# Patient Record
Sex: Female | Born: 1950 | Race: White | Hispanic: No | Marital: Married | Smoking: Never smoker
Health system: Southern US, Community
[De-identification: ages and names within clinical notes are randomized; demographics above are authoritative.]

## PROBLEM LIST (undated history)

## (undated) DIAGNOSIS — J189 Pneumonia, unspecified organism: Secondary | ICD-10-CM

## (undated) DIAGNOSIS — R519 Headache, unspecified: Secondary | ICD-10-CM

## (undated) DIAGNOSIS — K219 Gastro-esophageal reflux disease without esophagitis: Secondary | ICD-10-CM

## (undated) HISTORY — PX: COLONOSCOPY: SHX174

---

## 2000-05-06 ENCOUNTER — Emergency Department (HOSPITAL_COMMUNITY): Admission: EM | Admit: 2000-05-06 | Discharge: 2000-05-06 | Payer: Self-pay | Admitting: Emergency Medicine

## 2003-06-18 ENCOUNTER — Emergency Department (HOSPITAL_COMMUNITY): Admission: EM | Admit: 2003-06-18 | Discharge: 2003-06-18 | Payer: Self-pay | Admitting: Emergency Medicine

## 2003-06-18 ENCOUNTER — Encounter: Payer: Self-pay | Admitting: Emergency Medicine

## 2004-02-20 ENCOUNTER — Other Ambulatory Visit: Admission: RE | Admit: 2004-02-20 | Discharge: 2004-02-20 | Payer: Self-pay | Admitting: Family Medicine

## 2004-10-16 ENCOUNTER — Ambulatory Visit (HOSPITAL_COMMUNITY): Admission: RE | Admit: 2004-10-16 | Discharge: 2004-10-16 | Payer: Self-pay | Admitting: Gastroenterology

## 2005-04-15 ENCOUNTER — Other Ambulatory Visit: Admission: RE | Admit: 2005-04-15 | Discharge: 2005-04-15 | Payer: Self-pay | Admitting: Family Medicine

## 2007-09-12 ENCOUNTER — Other Ambulatory Visit: Admission: RE | Admit: 2007-09-12 | Discharge: 2007-09-12 | Payer: Self-pay | Admitting: Family Medicine

## 2008-10-29 ENCOUNTER — Other Ambulatory Visit: Admission: RE | Admit: 2008-10-29 | Discharge: 2008-10-29 | Payer: Self-pay | Admitting: Family Medicine

## 2010-02-16 ENCOUNTER — Encounter: Admission: RE | Admit: 2010-02-16 | Discharge: 2010-02-16 | Payer: Self-pay | Admitting: Family Medicine

## 2011-02-12 NOTE — Op Note (Signed)
NAMEAZHARIA, SURRATT                 ACCOUNT NO.:  [id number]   MEDICAL RECORD NO.:  [id number]          PATIENT TYPE:  AMB   LOCATION:  ENDO                         FACILITY:  [facility]   PHYSICIAN:  Petra Kuba, M.D.    DATE OF BIRTH:  1951/05/23   DATE OF PROCEDURE:  10/16/2004  DATE OF DISCHARGE:                                 OPERATIVE REPORT   PROCEDURE:  Esophagogastroduodenoscopy.   INDICATIONS FOR PROCEDURE:  Upper tract symptoms, longstanding.  Want to  proceed with a one-time EGD to rule out Barrett's or other etiologies.   Consent was signed after risks, benefits, methods, options thoroughly  discussed in the office and prior to any premeds given.   MEDICATIONS:  Additional medicines for this procedure were Demerol 10,  Versed 1.   PROCEDURE:  The video endoscope was inserted by direct vision.  The  esophagus was normal.  She did have a tiny hiatal hernia.  The scope was  passed into the stomach, advanced through a normal antrum, normal pylorus,  into a normal duodenal bulb, and around the C loop to a normal second  portion of the duodenum.  The scope was withdrawn back to the bulb, and a  good look there ruled out ulcers in all locations.  The scope was withdrawn  back to the stomach and retroflexed.  The angularis, cardia, fundus, lesser  and greater curve were normal on retroflexion visualization.  Straight  visualization of the stomach was normal.  No additional findings were seen.  Air was suctioned, the scope was slowly withdrawn.  Again, a good look at  the esophagus was normal.  The scope was removed.  The patient tolerated the  procedure well.  There was no obvious immediate complication.   ENDOSCOPIC DIAGNOSES:  1.  Tiny hiatal hernia.  2.  Otherwise normal esophagogastroduodenoscopy.   PLAN:  Happy to see back p.r.n.  Continue Nexium.  Yearly rectals and  guaiacs per Dr. Cliffton Asters.      MEM/MEDQ  D:  10/16/2004  T:  10/16/2004  Job:  [id number]   cc:   Stacie Acres. White, M.D.  [address]  [place]  [place] [address]  Fax: [phone]

## 2011-02-12 NOTE — Op Note (Signed)
Mary Walsh, PERUSKI                 ACCOUNT NO.:  [id number]   MEDICAL RECORD NO.:  [id number]          PATIENT TYPE:  AMB   LOCATION:  ENDO                         FACILITY:  [facility]   PHYSICIAN:  Petra Kuba, M.D.    DATE OF BIRTH:  1951/02/06   DATE OF PROCEDURE:  10/16/2004  DATE OF DISCHARGE:                                 OPERATIVE REPORT   PROCEDURE:  Colonoscopy.   INDICATIONS FOR PROCEDURE:  Screening.   Consent was signed after risks, benefits, methods, and options were  thoroughly discussed in the office.   MEDICINES USED:  Demerol 70, Versed 7.   DESCRIPTION OF PROCEDURE:  Rectal inspection was pertinent for external  hemorrhoids, small. Digital exam was negative. The pediatric video  adjustable colonoscope was inserted, easily advanced around the colon to the  cecum. This did not require any abdominal pressure or any position changes.  No abnormalities were seen on insertion.  The cecum was identified by the  appendiceal orifice and the ileocecal valve. In fact, the scope was inserted  a short ways into the terminal ileum which was normal. Photo documentation  was obtained. The scope was slowly withdrawn. The prep was adequate. There  was some liquid stool that required washing and suctioning. On slow  withdrawal through the colon, no abnormalities were seen, specifically no  polyps, tumors, masses, diverticula, etc.  Once back in the rectum,  anorectal pullthrough and retroflexion  some small hemorrhoids.  The scope  was straightened and readvanced a short ways up the left side of the colon,  air was suctioned, scope removed. The patient tolerated the procedure well.  There was no obvious or immediate complications.   ENDOSCOPIC DIAGNOSIS:  1.  Internal and external hemorrhoids.  2.  Otherwise within normal limits to the terminal ileum.   PLAN:  Recheck colon screening in five years.  Continue workup with a one  time EGD.      MEM/MEDQ  D:  10/16/2004  T:   10/16/2004  Job:  [id number]   cc:   Stacie Acres. White, M.D.  [address]  [place]  [place] [address]  Fax: [phone]

## 2011-06-24 ENCOUNTER — Other Ambulatory Visit: Payer: Self-pay | Admitting: Family Medicine

## 2011-06-24 DIAGNOSIS — Z1231 Encounter for screening mammogram for malignant neoplasm of breast: Secondary | ICD-10-CM

## 2011-07-12 ENCOUNTER — Ambulatory Visit
Admission: RE | Admit: 2011-07-12 | Discharge: 2011-07-12 | Disposition: A | Discharge status: Home / Self Care | Payer: BC Managed Care – PPO | Source: Ambulatory Visit | Attending: Family Medicine | Admitting: Family Medicine

## 2011-07-12 DIAGNOSIS — Z1231 Encounter for screening mammogram for malignant neoplasm of breast: Secondary | ICD-10-CM

## 2011-07-14 ENCOUNTER — Other Ambulatory Visit: Payer: Self-pay | Admitting: Family Medicine

## 2011-07-14 DIAGNOSIS — R928 Other abnormal and inconclusive findings on diagnostic imaging of breast: Secondary | ICD-10-CM

## 2011-07-30 ENCOUNTER — Ambulatory Visit
Admission: RE | Admit: 2011-07-30 | Discharge: 2011-07-30 | Disposition: A | Discharge status: Home / Self Care | Payer: BC Managed Care – PPO | Source: Ambulatory Visit | Attending: Family Medicine | Admitting: Family Medicine

## 2011-07-30 DIAGNOSIS — R928 Other abnormal and inconclusive findings on diagnostic imaging of breast: Secondary | ICD-10-CM

## 2012-08-08 ENCOUNTER — Other Ambulatory Visit (HOSPITAL_COMMUNITY)
Admission: RE | Admit: 2012-08-08 | Discharge: 2012-08-08 | Disposition: A | Discharge status: Home / Self Care | Payer: BC Managed Care – PPO | Source: Ambulatory Visit | Attending: Family Medicine | Admitting: Family Medicine

## 2012-08-08 ENCOUNTER — Other Ambulatory Visit: Payer: Self-pay | Admitting: Family Medicine

## 2012-08-08 DIAGNOSIS — Z0000 Encounter for general adult medical examination without abnormal findings: Secondary | ICD-10-CM | POA: Insufficient documentation

## 2014-07-01 ENCOUNTER — Other Ambulatory Visit: Payer: Self-pay | Admitting: Family Medicine

## 2014-07-01 ENCOUNTER — Other Ambulatory Visit (HOSPITAL_COMMUNITY)
Admission: RE | Admit: 2014-07-01 | Discharge: 2014-07-01 | Disposition: A | Discharge status: Home / Self Care | Payer: BC Managed Care – PPO | Source: Ambulatory Visit | Attending: Family Medicine | Admitting: Family Medicine

## 2014-07-01 DIAGNOSIS — Z124 Encounter for screening for malignant neoplasm of cervix: Secondary | ICD-10-CM | POA: Insufficient documentation

## 2014-07-04 LAB — CYTOLOGY - PAP

## 2015-09-19 ENCOUNTER — Other Ambulatory Visit: Payer: Self-pay | Admitting: Family Medicine

## 2015-09-19 DIAGNOSIS — Z1231 Encounter for screening mammogram for malignant neoplasm of breast: Secondary | ICD-10-CM

## 2015-10-17 ENCOUNTER — Ambulatory Visit
Admission: RE | Admit: 2015-10-17 | Discharge: 2015-10-17 | Disposition: A | Discharge status: Home / Self Care | Payer: Managed Care, Other (non HMO) | Source: Ambulatory Visit | Attending: Family Medicine | Admitting: Family Medicine

## 2015-10-17 DIAGNOSIS — Z1231 Encounter for screening mammogram for malignant neoplasm of breast: Secondary | ICD-10-CM

## 2016-02-21 ENCOUNTER — Emergency Department (HOSPITAL_BASED_OUTPATIENT_CLINIC_OR_DEPARTMENT_OTHER)
Admission: EM | Admit: 2016-02-21 | Discharge: 2016-02-21 | Disposition: A | Discharge status: Home / Self Care | Payer: Managed Care, Other (non HMO) | Attending: Emergency Medicine | Admitting: Emergency Medicine

## 2016-02-21 ENCOUNTER — Emergency Department (HOSPITAL_BASED_OUTPATIENT_CLINIC_OR_DEPARTMENT_OTHER): Payer: Managed Care, Other (non HMO)

## 2016-02-21 ENCOUNTER — Encounter (HOSPITAL_BASED_OUTPATIENT_CLINIC_OR_DEPARTMENT_OTHER): Payer: Self-pay | Admitting: Emergency Medicine

## 2016-02-21 DIAGNOSIS — Y999 Unspecified external cause status: Secondary | ICD-10-CM | POA: Diagnosis not present

## 2016-02-21 DIAGNOSIS — S63501A Unspecified sprain of right wrist, initial encounter: Secondary | ICD-10-CM | POA: Insufficient documentation

## 2016-02-21 DIAGNOSIS — S6991XA Unspecified injury of right wrist, hand and finger(s), initial encounter: Secondary | ICD-10-CM | POA: Diagnosis present

## 2016-02-21 DIAGNOSIS — X501XXA Overexertion from prolonged static or awkward postures, initial encounter: Secondary | ICD-10-CM | POA: Diagnosis not present

## 2016-02-21 DIAGNOSIS — Y929 Unspecified place or not applicable: Secondary | ICD-10-CM | POA: Insufficient documentation

## 2016-02-21 DIAGNOSIS — Y9301 Activity, walking, marching and hiking: Secondary | ICD-10-CM | POA: Diagnosis not present

## 2016-02-21 NOTE — ED Notes (Signed)
Patient states that her right wrist is hurting after being pulled by a dog. Noted swelling and bruising to her right wrist

## 2016-02-21 NOTE — ED Provider Notes (Signed)
CSN: [id number]     Arrival date & time 02/21/16  1538 History  By signing my name below, I, Mary Walsh, attest that this documentation has been prepared under the direction and in the presence of Malvin Johns, MD. Electronically Signed: Georgette Walsh, ED Scribe. 02/21/2016. 4:09 PM.      Chief Complaint  Patient presents with  . Wrist Pain   The history is provided by the patient. No language interpreter was used.   HPI Comments: Mary Walsh is a 65 y.o. female who presents to the Emergency Department complaining of moderate right wrist pain and swelling s/p injury that occurred today. Patient states she was walking her dog when her right wrist was twisted outward as the dog suddenly ran forward. Per pt, she then fell forward onto her bilateral hands and knees. She denies LOC, head injury, additional injuries. Pt is ambulatory without difficulty. Pt states her pain is worsened with movement. Patient denies numbness or paresthesia. Tetanus shots are up to date.   History reviewed. No pertinent past medical history. History reviewed. No pertinent past surgical history. History reviewed. No pertinent family history. Social History  Substance Use Topics  . Smoking status: Never Smoker   . Smokeless tobacco: None  . Alcohol Use: No   OB History    No data available     Review of Systems  Constitutional: Negative for fever.  Gastrointestinal: Negative for nausea and vomiting.  Musculoskeletal: Positive for joint swelling (right wrist) and arthralgias (right wrist). Negative for back pain and neck pain.  Skin: Negative for wound.  Neurological: Negative for weakness, numbness and headaches.       - paresthesia     Allergies  Codeine  Home Medications   Prior to Admission medications   Not on File   BP 139/80 mmHg  Pulse 94  Temp(Src) 98.8 F (37.1 C) (Oral)  Resp 18  Ht 5\' 8"  (1.727 m)  Wt 183 lb (83.008 kg)  BMI 27.83 kg/m2  SpO2 100% Physical Exam  Constitutional: She  is oriented to person, place, and time. She appears well-developed and well-nourished.  HENT:  Head: Normocephalic and atraumatic.  Neck: Normal range of motion. Neck supple.  Cardiovascular: Normal rate.   Right radial pulses are intact  Pulmonary/Chest: Effort normal.  Musculoskeletal: She exhibits edema and tenderness.  Swelling and ecchymosis to the dorsal side of right wrist overlying the distal radius. TTP to this area. No pain to hand or elbow. Normal motor function and sensation. No wounds.   Neurological: She is alert and oriented to person, place, and time.  Skin: Skin is warm and dry.  Psychiatric: She has a normal mood and affect.  Nursing note and vitals reviewed.   ED Course  Procedures (including critical care time) DIAGNOSTIC STUDIES: Oxygen Saturation is 100% on RA, normal by my interpretation.    COORDINATION OF CARE: 3:59 PM Discussed treatment plan with pt at bedside which includes x-ray and pt agreed to plan.  Imaging Review Dg Wrist Complete Right  02/21/2016  CLINICAL DATA:  Wrist injury while walking dog, initial encounter EXAM: RIGHT WRIST - COMPLETE 3+ VIEW COMPARISON:  None. FINDINGS: There is no evidence of fracture or dislocation. There is no evidence of arthropathy or other focal bone abnormality. Soft tissues are unremarkable. IMPRESSION: No acute abnormality noted. Electronically Signed   By: Inez Catalina M.D.   On: 02/21/2016 16:16   I have personally reviewed and evaluated these images as part of my medical  decision-making.  MDM   Final diagnoses:  Wrist sprain, right, initial encounter   No fracture noted.  NVI.  Will place in velcro wrist splint.  Advised Ice and elevation, NSAIDS.  Will refer to Dr. Barbaraann Barthel for f/u.   I personally performed the services described in this documentation, which was scribed in my presence.  The recorded information has been reviewed and considered.      Malvin Johns, MD 02/21/16 1710

## 2016-11-25 ENCOUNTER — Other Ambulatory Visit: Payer: Self-pay | Admitting: Family Medicine

## 2016-11-25 ENCOUNTER — Ambulatory Visit
Admission: RE | Admit: 2016-11-25 | Discharge: 2016-11-25 | Disposition: A | Discharge status: Home / Self Care | Payer: BLUE CROSS/BLUE SHIELD | Source: Ambulatory Visit | Attending: Family Medicine | Admitting: Family Medicine

## 2016-11-25 DIAGNOSIS — R509 Fever, unspecified: Secondary | ICD-10-CM

## 2018-06-14 DIAGNOSIS — Z823 Family history of stroke: Secondary | ICD-10-CM | POA: Diagnosis not present

## 2018-06-14 DIAGNOSIS — R69 Illness, unspecified: Secondary | ICD-10-CM | POA: Diagnosis not present

## 2018-06-14 DIAGNOSIS — Z91040 Latex allergy status: Secondary | ICD-10-CM | POA: Diagnosis not present

## 2018-06-14 DIAGNOSIS — Z809 Family history of malignant neoplasm, unspecified: Secondary | ICD-10-CM | POA: Diagnosis not present

## 2018-06-14 DIAGNOSIS — Z8249 Family history of ischemic heart disease and other diseases of the circulatory system: Secondary | ICD-10-CM | POA: Diagnosis not present

## 2018-06-14 DIAGNOSIS — M8580 Other specified disorders of bone density and structure, unspecified site: Secondary | ICD-10-CM | POA: Diagnosis not present

## 2018-11-16 ENCOUNTER — Other Ambulatory Visit (HOSPITAL_COMMUNITY)
Admission: RE | Admit: 2018-11-16 | Discharge: 2018-11-16 | Disposition: A | Discharge status: Home / Self Care | Payer: Medicare HMO | Source: Ambulatory Visit | Attending: Family Medicine | Admitting: Family Medicine

## 2018-11-16 ENCOUNTER — Other Ambulatory Visit: Payer: Self-pay | Admitting: Family Medicine

## 2018-11-16 DIAGNOSIS — Z1211 Encounter for screening for malignant neoplasm of colon: Secondary | ICD-10-CM | POA: Diagnosis not present

## 2018-11-16 DIAGNOSIS — Z0000 Encounter for general adult medical examination without abnormal findings: Secondary | ICD-10-CM | POA: Diagnosis not present

## 2018-11-16 DIAGNOSIS — M8588 Other specified disorders of bone density and structure, other site: Secondary | ICD-10-CM | POA: Diagnosis not present

## 2018-11-16 DIAGNOSIS — R7303 Prediabetes: Secondary | ICD-10-CM | POA: Diagnosis not present

## 2018-11-16 DIAGNOSIS — I4510 Unspecified right bundle-branch block: Secondary | ICD-10-CM | POA: Diagnosis not present

## 2018-11-16 DIAGNOSIS — Z124 Encounter for screening for malignant neoplasm of cervix: Secondary | ICD-10-CM | POA: Diagnosis not present

## 2018-11-16 DIAGNOSIS — E559 Vitamin D deficiency, unspecified: Secondary | ICD-10-CM | POA: Diagnosis not present

## 2018-11-16 DIAGNOSIS — E785 Hyperlipidemia, unspecified: Secondary | ICD-10-CM | POA: Diagnosis not present

## 2018-11-16 DIAGNOSIS — Z23 Encounter for immunization: Secondary | ICD-10-CM | POA: Diagnosis not present

## 2018-11-20 LAB — CYTOLOGY - PAP: DIAGNOSIS: NEGATIVE

## 2018-11-22 ENCOUNTER — Other Ambulatory Visit: Payer: Self-pay | Admitting: Family Medicine

## 2018-11-22 DIAGNOSIS — M8580 Other specified disorders of bone density and structure, unspecified site: Secondary | ICD-10-CM

## 2018-12-13 ENCOUNTER — Other Ambulatory Visit: Payer: Self-pay | Admitting: Family Medicine

## 2018-12-13 DIAGNOSIS — Z1231 Encounter for screening mammogram for malignant neoplasm of breast: Secondary | ICD-10-CM

## 2019-02-13 ENCOUNTER — Ambulatory Visit: Payer: Medicare HMO

## 2019-02-13 ENCOUNTER — Other Ambulatory Visit: Payer: Medicare HMO

## 2019-03-05 DIAGNOSIS — M2550 Pain in unspecified joint: Secondary | ICD-10-CM | POA: Diagnosis not present

## 2019-03-05 DIAGNOSIS — M25551 Pain in right hip: Secondary | ICD-10-CM | POA: Diagnosis not present

## 2019-03-05 DIAGNOSIS — R69 Illness, unspecified: Secondary | ICD-10-CM | POA: Diagnosis not present

## 2019-03-05 DIAGNOSIS — M25552 Pain in left hip: Secondary | ICD-10-CM | POA: Diagnosis not present

## 2019-04-03 DIAGNOSIS — M25551 Pain in right hip: Secondary | ICD-10-CM | POA: Diagnosis not present

## 2019-04-04 DIAGNOSIS — M25551 Pain in right hip: Secondary | ICD-10-CM | POA: Diagnosis not present

## 2019-04-09 ENCOUNTER — Ambulatory Visit
Admission: RE | Admit: 2019-04-09 | Discharge: 2019-04-09 | Disposition: A | Discharge status: Home / Self Care | Payer: Medicare HMO | Source: Ambulatory Visit | Attending: Family Medicine | Admitting: Family Medicine

## 2019-04-09 ENCOUNTER — Other Ambulatory Visit: Payer: Self-pay

## 2019-04-09 DIAGNOSIS — M8580 Other specified disorders of bone density and structure, unspecified site: Secondary | ICD-10-CM

## 2019-04-09 DIAGNOSIS — Z1231 Encounter for screening mammogram for malignant neoplasm of breast: Secondary | ICD-10-CM | POA: Diagnosis not present

## 2019-04-09 DIAGNOSIS — M8589 Other specified disorders of bone density and structure, multiple sites: Secondary | ICD-10-CM | POA: Diagnosis not present

## 2019-04-09 DIAGNOSIS — Z780 Asymptomatic menopausal state: Secondary | ICD-10-CM | POA: Diagnosis not present

## 2019-04-13 DIAGNOSIS — M25551 Pain in right hip: Secondary | ICD-10-CM | POA: Diagnosis not present

## 2019-04-18 DIAGNOSIS — Z1211 Encounter for screening for malignant neoplasm of colon: Secondary | ICD-10-CM | POA: Diagnosis not present

## 2019-04-23 DIAGNOSIS — R002 Palpitations: Secondary | ICD-10-CM | POA: Diagnosis not present

## 2019-04-23 DIAGNOSIS — R5382 Chronic fatigue, unspecified: Secondary | ICD-10-CM | POA: Diagnosis not present

## 2019-04-23 DIAGNOSIS — M8588 Other specified disorders of bone density and structure, other site: Secondary | ICD-10-CM | POA: Diagnosis not present

## 2019-04-27 DIAGNOSIS — M84351A Stress fracture, right femur, initial encounter for fracture: Secondary | ICD-10-CM | POA: Diagnosis not present

## 2019-04-27 DIAGNOSIS — M25551 Pain in right hip: Secondary | ICD-10-CM | POA: Diagnosis not present

## 2019-05-02 DIAGNOSIS — M8588 Other specified disorders of bone density and structure, other site: Secondary | ICD-10-CM | POA: Diagnosis not present

## 2019-05-02 DIAGNOSIS — M84351A Stress fracture, right femur, initial encounter for fracture: Secondary | ICD-10-CM | POA: Diagnosis not present

## 2019-05-09 DIAGNOSIS — M84351D Stress fracture, right femur, subsequent encounter for fracture with routine healing: Secondary | ICD-10-CM | POA: Diagnosis not present

## 2019-05-09 DIAGNOSIS — M84351A Stress fracture, right femur, initial encounter for fracture: Secondary | ICD-10-CM | POA: Diagnosis not present

## 2019-05-16 DIAGNOSIS — M84351D Stress fracture, right femur, subsequent encounter for fracture with routine healing: Secondary | ICD-10-CM | POA: Diagnosis not present

## 2019-05-16 DIAGNOSIS — M84351A Stress fracture, right femur, initial encounter for fracture: Secondary | ICD-10-CM | POA: Diagnosis not present

## 2019-05-22 ENCOUNTER — Other Ambulatory Visit: Payer: Self-pay

## 2019-05-22 ENCOUNTER — Encounter: Payer: Self-pay | Admitting: Physical Therapy

## 2019-05-22 ENCOUNTER — Ambulatory Visit: Payer: Medicare HMO | Attending: Orthopedic Surgery | Admitting: Physical Therapy

## 2019-05-22 DIAGNOSIS — M25551 Pain in right hip: Secondary | ICD-10-CM | POA: Diagnosis not present

## 2019-05-22 DIAGNOSIS — M25651 Stiffness of right hip, not elsewhere classified: Secondary | ICD-10-CM | POA: Diagnosis not present

## 2019-05-22 DIAGNOSIS — R262 Difficulty in walking, not elsewhere classified: Secondary | ICD-10-CM | POA: Insufficient documentation

## 2019-05-22 NOTE — Therapy (Signed)
[address] [place], [place], [address] Phone: [phone]   Fax:  [phone]  Physical Therapy Evaluation  Patient Details  Name: Mary Walsh MRN: [id number] Date of Birth: [birth year] Referring Provider (PT): Ainsley Spinner   Encounter Date: 05/22/2019  PT End of Session - 05/22/19 1134    Visit Number  1    Date for PT Re-Evaluation  07/22/19    PT Start Time  1057    PT Stop Time  1136    PT Time Calculation (min)  39 min    Activity Tolerance  Patient tolerated treatment well    Behavior During Therapy  [facility] for tasks assessed/performed       History reviewed. No pertinent past medical history.  History reviewed. No pertinent surgical history.  There were no vitals filed for this visit.   Subjective Assessment - 05/22/19 1100    Subjective  Patietn reports that at the end of March she swung a pick axe and hit a tree root and feels like she caused the fracture due to the hip pain stareting at that time.  She reports that she continued to walk and really started haivng pain, MRI revealed the fracture, she reports that over the past month the pain has really gotten a lot better.    Limitations  Walking;Lifting;House hold activities    Patient Stated Goals  have no pain, be stronger, be normal    Currently in Pain?  Yes    Pain Score  1     Pain Location  Hip    Pain Orientation  Right;Anterior    Pain Descriptors / Indicators  Aching;Sore;Tightness    Pain Type  Acute pain    Pain Radiating Towards  some pain into the right anterior thigh    Pain Onset  More than a month ago    Pain Frequency  Constant    Aggravating Factors   trying to jog, walking, sitting, hard seating surface, squatting at worst pain up to 8/10    Pain Relieving Factors  rest, pain meds pain can be 1/10    Effect of Pain on Daily Activities  really has limited me but I need to be stronger         [facility] PT Assessment -  05/22/19 0001      Assessment   Medical Diagnosis  right femoral neck fracture    Referring Provider (PT)  Ainsley Spinner    Onset Date/Surgical Date  01/20/19    Prior Therapy  no      Precautions   Precautions  None      Balance Screen   Has the patient fallen in the past 6 months  No    Has the patient had a decrease in activity level because of a fear of falling?   No    Is the patient reluctant to leave their home because of a fear of falling?   No      Home Environment   Additional Comments  has stairs, does some housework, takes care of 56 yo father      Prior Function   Level of Independence  Independent    Vocation  Retired    Leisure  walking      ROM / Strength   AROM / PROM / Strength  AROM;Strength      AROM   Overall AROM Comments  LROM is limited 25% with some pain in the  anterior hip and thigh      Strength   Strength Assessment Site  Hip    Right/Left Hip  Right    Right Hip Flexion  3+/5    Right Hip Extension  3+/5    Right Hip External Rotation   3+/5    Right Hip Internal Rotation  4-/5    Right Hip ABduction  3+/5      Palpation   Palpation comment  she is tight and tender in the buttock, the ITB and the anterior thigh, she is very tight and very tender in the right adductor      Ambulation/Gait   Gait Comments  slight antalgic on the right, did stairs wihthout much difficulty                Objective measurements completed on examination: See above findings.                PT Short Term Goals - 05/22/19 1139      PT SHORT TERM GOAL #1   Title  indepednent iwth initial HEP    Time  2    Period  Weeks    Status  New        PT Long Term Goals - 05/22/19 1139      PT LONG TERM GOAL #1   Title  resume walking program    Time  8    Period  Weeks    Status  New      PT LONG TERM GOAL #2   Title  decrease pain 50%    Time  8    Period  Weeks    Status  New      PT LONG TERM GOAL #3   Title  increase right hip  strnegth to 4/5    Time  8    Period  Weeks    Status  New      PT LONG TERM GOAL #4   Title  squat without difficulty    Time  8    Period  Weeks    Status  New             Plan - 05/22/19 1135    Clinical Impression Statement  Patient hurt her right hip in March and then did a lot of walking, she reports tremendous pain, she had an MRI that showed a stress fracture of the right femoral neck, she reports that she stopped everything and since then she has improved with pain and walking but is very weak.  She has some limitation of the lumbar ROM, she is very tight in the right adductor and the thigh.  Her goal is to get stronger and resume some sort of activity    Stability/Clinical Decision Making  Stable/Uncomplicated    Clinical Decision Making  Low    Rehab Potential  Good    PT Frequency  1x / week    PT Duration  8 weeks    PT Treatment/Interventions  ADLs/Self Care Home Management;Cryotherapy;Electrical Stimulation;Iontophoresis 4mg /ml Dexamethasone;Moist Heat;Ultrasound;Therapeutic activities;Stair training;Gait training;Therapeutic exercise;Balance training;Neuromuscular re-education;Patient/family education;Manual techniques;Dry needling    PT Next Visit Plan  patietn has improved on her own with pain and motions now just needs strength and function, slowly start activity to address this, could address the adductor tightness as well    Consulted and Agree with Plan of Care  Patient       Patient will benefit from skilled therapeutic intervention in order to improve  the following deficits and impairments:  Abnormal gait, Pain, Increased muscle spasms, Decreased mobility, Decreased activity tolerance, Decreased endurance, Decreased range of motion, Decreased strength, Impaired flexibility, Difficulty walking  Visit Diagnosis: Pain in right hip - Plan: PT plan of care cert/re-cert  Stiffness of right hip, not elsewhere classified - Plan: PT plan of care  cert/re-cert  Difficulty in walking, not elsewhere classified - Plan: PT plan of care cert/re-cert     Problem List There are no active problems to display for this patient.   Sumner Boast., PT 05/22/2019, 11:42 AM  [address] [place], [place], [address] Phone: [phone]   Fax:  [phone]  Name: Mary Walsh MRN: [id number] Date of Birth: 1951/09/14

## 2019-05-22 NOTE — Patient Instructions (Signed)
Access Code: [email or web address]  URL: [email or web address]  Date: 05/22/2019  Prepared by: Lum Babe   Exercises  Supine Bridge - 10 reps - 2 sets - 2 hold - 2x daily - 7x weekly  Bridge with Hip Abduction and Resistance - 10 reps - 2 sets - 3 hold - 2x daily - 7x weekly  Standing Hip Abduction - 10 reps - 2 sets - 2 hold - 2x daily - 7x weekly  Supine Butterfly Groin Stretch - 5 reps - 1 sets - 20 hold - 2x daily - 7x weekly

## 2019-05-29 ENCOUNTER — Encounter: Payer: Self-pay | Admitting: Physical Therapy

## 2019-05-29 ENCOUNTER — Ambulatory Visit: Payer: Medicare HMO | Attending: Orthopedic Surgery | Admitting: Physical Therapy

## 2019-05-29 ENCOUNTER — Other Ambulatory Visit: Payer: Self-pay

## 2019-05-29 DIAGNOSIS — M25651 Stiffness of right hip, not elsewhere classified: Secondary | ICD-10-CM

## 2019-05-29 DIAGNOSIS — R262 Difficulty in walking, not elsewhere classified: Secondary | ICD-10-CM | POA: Diagnosis not present

## 2019-05-29 DIAGNOSIS — M25551 Pain in right hip: Secondary | ICD-10-CM | POA: Diagnosis not present

## 2019-05-29 NOTE — Therapy (Signed)
[address] [place], [place], [address] Phone: [phone]   Fax:  [phone]  Physical Therapy Treatment  Patient Details  Name: Mary Walsh MRN: [id number] Date of Birth: August 20, 1951 Referring Provider (PT): Ainsley Spinner   Encounter Date: 05/29/2019  PT End of Session - 05/29/19 1140    Visit Number  2    Date for PT Re-Evaluation  07/22/19    PT Start Time  1054    PT Stop Time  1155    PT Time Calculation (min)  61 min    Activity Tolerance  Patient tolerated treatment well    Behavior During Therapy  [facility] for tasks assessed/performed       History reviewed. No pertinent past medical history.  History reviewed. No pertinent surgical history.  There were no vitals filed for this visit.  Subjective Assessment - 05/29/19 1054    Subjective  Patient reports that over the past week she has been a lot more active.  She reports that she is having a little more pain today in the hip and into the right thig and knee.    Currently in Pain?  Yes    Pain Score  3     Pain Location  Hip    Pain Orientation  Right    Pain Descriptors / Indicators  Aching    Aggravating Factors   shopping, walking                       [facility] Adult PT Treatment/Exercise - 05/29/19 0001      Exercises   Exercises  Knee/Hip      Knee/Hip Exercises: Stretches   Passive Hamstring Stretch  Right;3 reps;20 seconds    Hip Flexor Stretch  Right;3 reps;10 seconds    ITB Stretch  3 reps;Right;20 seconds    Piriformis Stretch  Right;3 reps;20 seconds      Knee/Hip Exercises: Aerobic   Recumbent Bike  4 minutes    Nustep  Level 4 x 5 minutes      Knee/Hip Exercises: Machines for Strengthening   Cybex Knee Extension  5# 2x10    Cybex Knee Flexion  20# 2x10    Cybex Leg Press  2x10 no weight and then 20# x 10      Knee/Hip Exercises: Supine   Other Supine Knee/Hip Exercises  feet on ball K2C, trunk rotaiton,  small bridges, isometric abs      Modalities   Modalities  Electrical Stimulation;Moist Heat      Moist Heat Therapy   Number Minutes Moist Heat  15 Minutes    Moist Heat Location  Hip      Electrical Stimulation   Electrical Stimulation Location  right anterior and lateral hip    Electrical Stimulation Action  IFC    Electrical Stimulation Parameters  supine    Electrical Stimulation Goals  Pain               PT Short Term Goals - 05/29/19 1142      PT SHORT TERM GOAL #1   Title  indepednent iwth initial HEP    Status  Achieved        PT Long Term Goals - 05/22/19 1139      PT LONG TERM GOAL #1   Title  resume walking program    Time  8    Period  Weeks    Status  New  PT LONG TERM GOAL #2   Title  decrease pain 50%    Time  8    Period  Weeks    Status  New      PT LONG TERM GOAL #3   Title  increase right hip strnegth to 4/5    Time  8    Period  Weeks    Status  New      PT LONG TERM GOAL #4   Title  squat without difficulty    Time  8    Period  Weeks    Status  New            Plan - 05/29/19 1141    Clinical Impression Statement  Patient seems to have a lot of tightness around the right hip mms, she is tense and guarded.  She had to have some adjustments to the exercises due to some pain/tenderness and tightness but overall did well with the initiation of exercise.  Used modalities to see if we can get the mms to relax    PT Next Visit Plan  adjust treatment as necessary, seems like tightness is an issue with spasms still gaurding    Consulted and Agree with Plan of Care  Patient       Patient will benefit from skilled therapeutic intervention in order to improve the following deficits and impairments:  Abnormal gait, Pain, Increased muscle spasms, Decreased mobility, Decreased activity tolerance, Decreased endurance, Decreased range of motion, Decreased strength, Impaired flexibility, Difficulty walking  Visit Diagnosis: Pain in  right hip  Stiffness of right hip, not elsewhere classified  Difficulty in walking, not elsewhere classified     Problem List There are no active problems to display for this patient.   Sumner Boast., PT 05/29/2019, 11:43 AM  [address] [place], [place], [address] Phone: [phone]   Fax:  [phone]  Name: Mary Walsh MRN: [id number] Date of Birth: September 06, 1951

## 2019-06-05 ENCOUNTER — Encounter: Payer: Self-pay | Admitting: Physical Therapy

## 2019-06-05 ENCOUNTER — Other Ambulatory Visit: Payer: Self-pay

## 2019-06-05 ENCOUNTER — Ambulatory Visit: Payer: Medicare HMO | Admitting: Physical Therapy

## 2019-06-05 DIAGNOSIS — M25651 Stiffness of right hip, not elsewhere classified: Secondary | ICD-10-CM

## 2019-06-05 DIAGNOSIS — M25551 Pain in right hip: Secondary | ICD-10-CM | POA: Diagnosis not present

## 2019-06-05 DIAGNOSIS — R262 Difficulty in walking, not elsewhere classified: Secondary | ICD-10-CM

## 2019-06-05 NOTE — Therapy (Signed)
[address] [place], [place], [address] Phone: [phone]   Fax:  [phone]  Physical Therapy Treatment  Patient Details  Name: Mary Walsh MRN: [id number] Date of Birth: 1951/06/24 Referring Provider (PT): Ainsley Spinner   Encounter Date: 06/05/2019  PT End of Session - 06/05/19 1100    Visit Number  3    Date for PT Re-Evaluation  07/22/19    PT Start Time  0928    PT Stop Time  1030    PT Time Calculation (min)  62 min    Activity Tolerance  Patient tolerated treatment well    Behavior During Therapy  [facility] for tasks assessed/performed       History reviewed. No pertinent past medical history.  History reviewed. No pertinent surgical history.  There were no vitals filed for this visit.  Subjective Assessment - 06/05/19 0935    Subjective  I feel like I am getting better, still tight    Currently in Pain?  Yes    Pain Score  2     Pain Location  Hip    Pain Orientation  Right;Anterior    Pain Descriptors / Indicators  Tightness                       [facility] Adult PT Treatment/Exercise - 06/05/19 0001      Knee/Hip Exercises: Stretches   Passive Hamstring Stretch  Right;3 reps;20 seconds    Hip Flexor Stretch  Right;3 reps;10 seconds    ITB Stretch  3 reps;Right;20 seconds    Piriformis Stretch  Right;3 reps;20 seconds      Knee/Hip Exercises: Aerobic   Elliptical  I=10, R=5 x 1 minutes    Recumbent Bike  4 minutes leve 0    Nustep  Level 5 x 5 minutes      Knee/Hip Exercises: Machines for Strengthening   Cybex Knee Extension  5# 2x10    Cybex Knee Flexion  20# 2x10    Cybex Leg Press  2x10 no weight and then 20# x 10      Knee/Hip Exercises: Standing   Hip Flexion  Both;1 set;10 reps    Hip Flexion Limitations  2.5#    Hip Abduction  Both;1 set;10 reps    Abduction Limitations  2.5#      Knee/Hip Exercises: Supine   Other Supine Knee/Hip Exercises  feet on ball K2C,  trunk rotaiton, small bridges, isometric abs      Modalities   Modalities  Electrical Stimulation;Moist Heat      Moist Heat Therapy   Number Minutes Moist Heat  15 Minutes    Moist Heat Location  Hip      Electrical Stimulation   Electrical Stimulation Location  right anterior and lateral hip    Electrical Stimulation Action  IFC    Electrical Stimulation Parameters  supine    Electrical Stimulation Goals  Pain               PT Short Term Goals - 05/29/19 1142      PT SHORT TERM GOAL #1   Title  indepednent iwth initial HEP    Status  Achieved        PT Long Term Goals - 06/05/19 1102      PT LONG TERM GOAL #1   Title  resume walking program    Status  On-going      PT LONG TERM  GOAL #2   Title  decrease pain 50%    Status  On-going            Plan - 06/05/19 1101    Clinical Impression Statement  Patient is very tight in the quad and hip flexor.  She tolerated adding exercises wihtout pain.  She reports that she feels stronger but reports that she is still very weak    PT Next Visit Plan  adjust treatment as necessary, seems like tightness is an issue with spasms still gaurding    Consulted and Agree with Plan of Care  Patient       Patient will benefit from skilled therapeutic intervention in order to improve the following deficits and impairments:  Abnormal gait, Pain, Increased muscle spasms, Decreased mobility, Decreased activity tolerance, Decreased endurance, Decreased range of motion, Decreased strength, Impaired flexibility, Difficulty walking  Visit Diagnosis: Pain in right hip  Stiffness of right hip, not elsewhere classified  Difficulty in walking, not elsewhere classified     Problem List There are no active problems to display for this patient.   Sumner Boast., PT 06/05/2019, 11:02 AM  [address] [place], [place], [address] Phone: [phone]    Fax:  [phone]  Name: DORIS GRAVOIS MRN: [id number] Date of Birth: 1951-08-01

## 2019-06-11 ENCOUNTER — Ambulatory Visit: Payer: Medicare HMO | Admitting: Physical Therapy

## 2019-06-18 ENCOUNTER — Encounter: Payer: Self-pay | Admitting: Physical Therapy

## 2019-06-18 ENCOUNTER — Other Ambulatory Visit: Payer: Self-pay

## 2019-06-18 ENCOUNTER — Ambulatory Visit: Payer: Medicare HMO | Admitting: Physical Therapy

## 2019-06-18 DIAGNOSIS — M25551 Pain in right hip: Secondary | ICD-10-CM | POA: Diagnosis not present

## 2019-06-18 DIAGNOSIS — H43811 Vitreous degeneration, right eye: Secondary | ICD-10-CM | POA: Diagnosis not present

## 2019-06-18 DIAGNOSIS — M25651 Stiffness of right hip, not elsewhere classified: Secondary | ICD-10-CM

## 2019-06-18 DIAGNOSIS — R262 Difficulty in walking, not elsewhere classified: Secondary | ICD-10-CM

## 2019-06-18 DIAGNOSIS — Z0100 Encounter for examination of eyes and vision without abnormal findings: Secondary | ICD-10-CM | POA: Diagnosis not present

## 2019-06-18 NOTE — Therapy (Signed)
[address] [place], [place], [address] Phone: [phone]   Fax:  [phone]  Physical Therapy Treatment  Patient Details  Name: Mary Walsh MRN: [id number] Date of Birth: 1950-11-04 Referring Provider (PT): Ainsley Spinner   Encounter Date: 06/18/2019  PT End of Session - 06/18/19 1123    Visit Number  4    Date for PT Re-Evaluation  07/22/19    PT Start Time  1014    PT Stop Time  1100    PT Time Calculation (min)  46 min    Activity Tolerance  Patient tolerated treatment well    Behavior During Therapy  [facility] for tasks assessed/performed       History reviewed. No pertinent past medical history.  History reviewed. No pertinent surgical history.  There were no vitals filed for this visit.  Subjective Assessment - 06/18/19 1019    Subjective  I was out last week due to vertigo.  REports that the hip is feeling better, less pain, "feels weak"    Currently in Pain?  Yes    Pain Score  1     Pain Location  Hip    Pain Orientation  Right    Pain Descriptors / Indicators  Tightness    Aggravating Factors   c/o tightness with walking                       [facility] Adult PT Treatment/Exercise - 06/18/19 0001      Knee/Hip Exercises: Stretches   Passive Hamstring Stretch  Right;3 reps;20 seconds    Hip Flexor Stretch  Right;3 reps;10 seconds    Piriformis Stretch  Right;3 reps;20 seconds      Knee/Hip Exercises: Aerobic   Recumbent Bike  Level 1 x 5 minutes    Other Aerobic  gait around the building 1 1/2 laps, stairs, and resisted gait all directions      Knee/Hip Exercises: Machines for Strengthening   Cybex Knee Extension  5# 2x10      Knee/Hip Exercises: Standing   Hip Flexion  Both;1 set;10 reps    Hip Abduction  Both;2 sets;10 reps    Abduction Limitations  2.5#    Hip Extension  Both;2 sets;10 reps    Extension Limitations  2.5#      Knee/Hip Exercises: Supine   Bridges  with Ball Squeeze  2 sets;10 reps               PT Short Term Goals - 05/29/19 1142      PT SHORT TERM GOAL #1   Title  indepednent iwth initial HEP    Status  Achieved        PT Long Term Goals - 06/18/19 1124      PT LONG TERM GOAL #1   Title  resume walking program    Status  Partially Met      PT LONG TERM GOAL #2   Title  decrease pain 50%    Status  Achieved      PT LONG TERM GOAL #3   Title  increase right hip strnegth to 4/5    Status  Partially Met      PT LONG TERM GOAL #4   Title  squat without difficulty    Status  Partially Met            Plan - 06/18/19 1123    Clinical Impression Statement  Patient  overall reports that she is doing much bett, less pain, walking more and feeling stronger, still having some issues with tightness and has some difficulty with exercises and has some fear of hurting herself if she returns to a gym    PT Next Visit Plan  patient is to see the MD and then may return  after that    Consulted and Agree with Plan of Care  Patient       Patient will benefit from skilled therapeutic intervention in order to improve the following deficits and impairments:  Abnormal gait, Pain, Increased muscle spasms, Decreased mobility, Decreased activity tolerance, Decreased endurance, Decreased range of motion, Decreased strength, Impaired flexibility, Difficulty walking  Visit Diagnosis: Pain in right hip  Stiffness of right hip, not elsewhere classified  Difficulty in walking, not elsewhere classified     Problem List There are no active problems to display for this patient.   Sumner Boast., PT  06/18/2019, 11:25 AM  [address] [place], [place], [address] Phone: [phone]   Fax:  [phone]  Name: EUGENIE HAREWOOD MRN: [id number] Date of Birth: October 23, 1950

## 2019-06-20 DIAGNOSIS — M84351A Stress fracture, right femur, initial encounter for fracture: Secondary | ICD-10-CM | POA: Diagnosis not present

## 2019-06-20 DIAGNOSIS — M84351D Stress fracture, right femur, subsequent encounter for fracture with routine healing: Secondary | ICD-10-CM | POA: Diagnosis not present

## 2019-06-26 DIAGNOSIS — M25551 Pain in right hip: Secondary | ICD-10-CM | POA: Diagnosis not present

## 2019-07-02 DIAGNOSIS — M25551 Pain in right hip: Secondary | ICD-10-CM | POA: Diagnosis not present

## 2019-07-09 DIAGNOSIS — M25551 Pain in right hip: Secondary | ICD-10-CM | POA: Diagnosis not present

## 2019-07-16 DIAGNOSIS — M25551 Pain in right hip: Secondary | ICD-10-CM | POA: Diagnosis not present

## 2019-07-24 DIAGNOSIS — M25551 Pain in right hip: Secondary | ICD-10-CM | POA: Diagnosis not present

## 2019-07-31 DIAGNOSIS — M25551 Pain in right hip: Secondary | ICD-10-CM | POA: Diagnosis not present

## 2019-08-07 DIAGNOSIS — M25551 Pain in right hip: Secondary | ICD-10-CM | POA: Diagnosis not present

## 2019-08-14 DIAGNOSIS — M25551 Pain in right hip: Secondary | ICD-10-CM | POA: Diagnosis not present

## 2019-09-15 DIAGNOSIS — Z8249 Family history of ischemic heart disease and other diseases of the circulatory system: Secondary | ICD-10-CM | POA: Diagnosis not present

## 2019-09-15 DIAGNOSIS — Z6825 Body mass index (BMI) 25.0-25.9, adult: Secondary | ICD-10-CM | POA: Diagnosis not present

## 2019-09-15 DIAGNOSIS — Z823 Family history of stroke: Secondary | ICD-10-CM | POA: Diagnosis not present

## 2019-09-15 DIAGNOSIS — R69 Illness, unspecified: Secondary | ICD-10-CM | POA: Diagnosis not present

## 2019-09-15 DIAGNOSIS — E669 Obesity, unspecified: Secondary | ICD-10-CM | POA: Diagnosis not present

## 2019-09-15 DIAGNOSIS — Z79890 Hormone replacement therapy (postmenopausal): Secondary | ICD-10-CM | POA: Diagnosis not present

## 2019-09-15 DIAGNOSIS — R32 Unspecified urinary incontinence: Secondary | ICD-10-CM | POA: Diagnosis not present

## 2019-09-26 DIAGNOSIS — M25551 Pain in right hip: Secondary | ICD-10-CM | POA: Diagnosis not present

## 2019-09-26 DIAGNOSIS — M84351D Stress fracture, right femur, subsequent encounter for fracture with routine healing: Secondary | ICD-10-CM | POA: Diagnosis not present

## 2019-10-21 DIAGNOSIS — Z20828 Contact with and (suspected) exposure to other viral communicable diseases: Secondary | ICD-10-CM | POA: Diagnosis not present

## 2019-10-30 DIAGNOSIS — Z7689 Persons encountering health services in other specified circumstances: Secondary | ICD-10-CM | POA: Diagnosis not present

## 2019-11-27 ENCOUNTER — Encounter: Payer: Self-pay | Admitting: Cardiovascular Disease

## 2019-11-27 DIAGNOSIS — Z1211 Encounter for screening for malignant neoplasm of colon: Secondary | ICD-10-CM | POA: Diagnosis not present

## 2019-11-27 DIAGNOSIS — E559 Vitamin D deficiency, unspecified: Secondary | ICD-10-CM | POA: Diagnosis not present

## 2019-11-27 DIAGNOSIS — M8588 Other specified disorders of bone density and structure, other site: Secondary | ICD-10-CM | POA: Diagnosis not present

## 2019-11-27 DIAGNOSIS — R7303 Prediabetes: Secondary | ICD-10-CM | POA: Diagnosis not present

## 2019-11-27 DIAGNOSIS — Z23 Encounter for immunization: Secondary | ICD-10-CM | POA: Diagnosis not present

## 2019-11-27 DIAGNOSIS — Z0000 Encounter for general adult medical examination without abnormal findings: Secondary | ICD-10-CM | POA: Diagnosis not present

## 2019-11-27 DIAGNOSIS — E785 Hyperlipidemia, unspecified: Secondary | ICD-10-CM | POA: Diagnosis not present

## 2020-02-22 DIAGNOSIS — Z818 Family history of other mental and behavioral disorders: Secondary | ICD-10-CM | POA: Diagnosis not present

## 2020-02-22 DIAGNOSIS — R69 Illness, unspecified: Secondary | ICD-10-CM | POA: Diagnosis not present

## 2020-02-22 DIAGNOSIS — R030 Elevated blood-pressure reading, without diagnosis of hypertension: Secondary | ICD-10-CM | POA: Diagnosis not present

## 2020-02-22 DIAGNOSIS — Z823 Family history of stroke: Secondary | ICD-10-CM | POA: Diagnosis not present

## 2020-02-22 DIAGNOSIS — Z882 Allergy status to sulfonamides status: Secondary | ICD-10-CM | POA: Diagnosis not present

## 2020-02-22 DIAGNOSIS — Z8249 Family history of ischemic heart disease and other diseases of the circulatory system: Secondary | ICD-10-CM | POA: Diagnosis not present

## 2020-02-22 DIAGNOSIS — Z79890 Hormone replacement therapy (postmenopausal): Secondary | ICD-10-CM | POA: Diagnosis not present

## 2020-02-22 DIAGNOSIS — Z87892 Personal history of anaphylaxis: Secondary | ICD-10-CM | POA: Diagnosis not present

## 2020-02-22 DIAGNOSIS — Z7722 Contact with and (suspected) exposure to environmental tobacco smoke (acute) (chronic): Secondary | ICD-10-CM | POA: Diagnosis not present

## 2020-02-22 DIAGNOSIS — R32 Unspecified urinary incontinence: Secondary | ICD-10-CM | POA: Diagnosis not present

## 2020-03-03 DIAGNOSIS — R69 Illness, unspecified: Secondary | ICD-10-CM | POA: Diagnosis not present

## 2020-03-11 DIAGNOSIS — R002 Palpitations: Secondary | ICD-10-CM | POA: Diagnosis not present

## 2020-03-12 DIAGNOSIS — R69 Illness, unspecified: Secondary | ICD-10-CM | POA: Diagnosis not present

## 2020-03-13 ENCOUNTER — Other Ambulatory Visit: Payer: Self-pay

## 2020-03-13 ENCOUNTER — Encounter: Payer: Self-pay | Admitting: Cardiovascular Disease

## 2020-03-13 ENCOUNTER — Ambulatory Visit (INDEPENDENT_AMBULATORY_CARE_PROVIDER_SITE_OTHER): Payer: Medicare HMO | Admitting: Cardiovascular Disease

## 2020-03-13 DIAGNOSIS — R002 Palpitations: Secondary | ICD-10-CM | POA: Diagnosis not present

## 2020-03-13 NOTE — Progress Notes (Signed)
03/13/2020 Steward Drone   13-Mar-1951  [id number]  Primary Physician Harlan Stains, MD Primary Cardiologist: Lorretta Harp MD [name], [place]  HPI:  Mary Walsh is a 69 y.o. mildly overweight married Caucasian female mother of 2 children, grandmother of 4 grandchildren who is retired from being the [employer] for associated [place].  She was referred to me by her PCP, Dr. Dema Severin, for evaluation of symptomatic palpitations.  She basically has no cardiac risk factors other than a mother who has had stents in a father who has had a A. fib and ablation.  She is never had a heart attack or stroke.  She denies chest pain or shortness of breath.  She does drink caffeine in the morning.  She works out 3 to 4 days a week without limitation.  She developed palpitations in 2019 which at that time were positional when she was on her left side.  These have become more frequent.  They have awakened her from sleep.  She did otherwise has had no episodes of dizziness chest pain or shortness of breath.   Current Meds  Medication Sig  . estradiol (ESTRACE) 0.1 MG/GM vaginal cream Place 1 Applicatorful vaginally at bedtime.  . progesterone (ENDOMETRIN) 100 MG vaginal insert 100 mg. TAKE TWO TABLET ONCE DAILY.     Allergies  Allergen Reactions  . Codeine Anxiety    Social History   Socioeconomic History  . Marital status: Married    Spouse name: Not on file  . Number of children: Not on file  . Years of education: Not on file  . Highest education level: Not on file  Occupational History  . Not on file  Tobacco Use  . Smoking status: Never Smoker  Substance and Sexual Activity  . Alcohol use: No  . Drug use: No  . Sexual activity: Not on file  Other Topics Concern  . Not on file  Social History Narrative  . Not on file   Social Determinants of Health   Financial Resource Strain:   . Difficulty of Paying Living Expenses:   Food Insecurity:   . Worried About  Charity fundraiser in the Last Year:   . Arboriculturist in the Last Year:   Transportation Needs:   . Film/video editor (Medical):   Marland Kitchen Lack of Transportation (Non-Medical):   Physical Activity:   . Days of Exercise per Week:   . Minutes of Exercise per Session:   Stress:   . Feeling of Stress :   Social Connections:   . Frequency of Communication with Friends and Family:   . Frequency of Social Gatherings with Friends and Family:   . Attends Religious Services:   . Active Member of Clubs or Organizations:   . Attends Archivist Meetings:   Marland Kitchen Marital Status:   Intimate Partner Violence:   . Fear of Current or Ex-Partner:   . Emotionally Abused:   Marland Kitchen Physically Abused:   . Sexually Abused:      Review of Systems: General: negative for chills, fever, night sweats or weight changes.  Cardiovascular: negative for chest pain, dyspnea on exertion, edema, orthopnea, palpitations, paroxysmal nocturnal dyspnea or shortness of breath Dermatological: negative for rash Respiratory: negative for cough or wheezing Urologic: negative for hematuria Abdominal: negative for nausea, vomiting, diarrhea, bright red blood per rectum, melena, or hematemesis Neurologic: negative for visual changes, syncope, or dizziness All other systems reviewed and are  otherwise negative except as noted above.    Blood pressure 132/84, pulse 76, height 5\' 7"  (1.702 m), weight 161 lb (73 kg), SpO2 98 %.  General appearance: alert and no distress Neck: no adenopathy, no carotid bruit, no JVD, supple, symmetrical, trachea midline and thyroid not enlarged, symmetric, no tenderness/mass/nodules Lungs: clear to auscultation bilaterally Heart: regular rate and rhythm, S1, S2 normal, no murmur, click, rub or gallop Extremities: extremities normal, atraumatic, no cyanosis or edema Pulses: 2+ and symmetric Skin: Skin color, texture, turgor normal. No rashes or lesions Neurologic: Alert and oriented X 3,  normal strength and tone. Normal symmetric reflexes. Normal coordination and gait  EKG sinus rhythm at 76 with right bundle branch block.  I personally reviewed this EKG.  ASSESSMENT AND PLAN:   Palpitations Mary Walsh was referred to me by Dr. Dema Severin for evaluation of symptomatic palpitations.  She has had these since 2019.  Initially they were positional.  She does drink some caffeine in the morning.  She also feels that it may be related to proximity to radiofrequency like 5G.  Recent thyroid function tests were unremarkable.  These have been occurring more frequently.  I am going to get a 2D echo and a 30-day event monitor to further evaluate.      Lorretta Harp MD FACP,FACC,FAHA, [facility] 03/13/2020 3:25 PM

## 2020-03-13 NOTE — Patient Instructions (Signed)
Medication Instructions:  Your Physician recommend you continue on your current medication as directed.    *If you need a refill on your cardiac medications before your next appointment, please call your pharmacy*   Lab Work: None  Testing/Procedures: Your physician has requested that you have an echocardiogram. Echocardiography is a painless test that uses sound waves to create images of your heart. It provides your doctor with information about the size and shape of your heart and how well your heart's chambers and valves are working. This procedure takes approximately one hour. There are no restrictions for this procedure. [place] has recommended that you wear a 30 day event monitor. Event monitors are medical devices that record the heart's electrical activity. Doctors most often [place] these monitors to diagnose arrhythmias. Arrhythmias are problems with the speed or rhythm of the heartbeat. The monitor is a small, portable device. You can wear one while you do your normal daily activities. This is usually used to diagnose what is causing palpitations/syncope (passing out). Someone from our office will call to mail monitor        Follow-Up: At [facility], you and your health needs are our priority.  As part of our continuing mission to provide you with exceptional heart care, we have created designated Provider Care Teams.  These Care Teams include your primary Cardiologist (physician) and Advanced Practice Providers (APPs -  Physician Assistants and Nurse Practitioners) who all work together to provide you with the care you need, when you need it.  We recommend signing up for the patient portal called "MyChart".  Sign up information is provided on this After Visit Summary.  MyChart is used to connect with patients for Virtual Visits (Telemedicine).  Patients are able to view lab/test results, encounter notes, upcoming appointments, etc.  Non-urgent  messages can be sent to your provider as well.   To learn more about what you can do with MyChart, go to [email or web address].    Your next appointment:   3 month(s)  The format for your next appointment:   In Person  Provider:   Quay Burow, MD

## 2020-03-13 NOTE — Assessment & Plan Note (Signed)
Ms. Rita was referred to me by Dr. Dema Severin for evaluation of symptomatic palpitations.  She has had these since 2019.  Initially they were positional.  She does drink some caffeine in the morning.  She also feels that it may be related to proximity to radiofrequency like 5G.  Recent thyroid function tests were unremarkable.  These have been occurring more frequently.  I am going to get a 2D echo and a 30-day event monitor to further evaluate.

## 2020-03-14 ENCOUNTER — Telehealth: Payer: Self-pay

## 2020-03-14 NOTE — Telephone Encounter (Signed)
Pt registered for 30 day Event Monitor to be mailed to pt's home.

## 2020-03-25 ENCOUNTER — Ambulatory Visit (INDEPENDENT_AMBULATORY_CARE_PROVIDER_SITE_OTHER): Payer: Medicare HMO

## 2020-03-25 DIAGNOSIS — R002 Palpitations: Secondary | ICD-10-CM

## 2020-04-02 ENCOUNTER — Ambulatory Visit (HOSPITAL_COMMUNITY): Payer: Medicare HMO | Attending: Cardiology

## 2020-04-02 ENCOUNTER — Other Ambulatory Visit: Payer: Self-pay

## 2020-04-02 DIAGNOSIS — Z8249 Family history of ischemic heart disease and other diseases of the circulatory system: Secondary | ICD-10-CM | POA: Insufficient documentation

## 2020-04-02 DIAGNOSIS — I340 Nonrheumatic mitral (valve) insufficiency: Secondary | ICD-10-CM | POA: Insufficient documentation

## 2020-04-02 DIAGNOSIS — R002 Palpitations: Secondary | ICD-10-CM | POA: Insufficient documentation

## 2020-04-08 ENCOUNTER — Telehealth: Payer: Self-pay | Admitting: Cardiovascular Disease

## 2020-04-08 NOTE — Telephone Encounter (Signed)
Pt aware of echo results ./cy 

## 2020-04-08 NOTE — Telephone Encounter (Signed)
Patient is returning phone call about her results. Please call back

## 2020-05-02 ENCOUNTER — Other Ambulatory Visit: Payer: Self-pay | Admitting: Family Medicine

## 2020-05-02 DIAGNOSIS — Z1231 Encounter for screening mammogram for malignant neoplasm of breast: Secondary | ICD-10-CM

## 2020-05-14 ENCOUNTER — Ambulatory Visit
Admission: RE | Admit: 2020-05-14 | Discharge: 2020-05-14 | Disposition: A | Discharge status: Home / Self Care | Payer: Medicare HMO | Source: Ambulatory Visit | Attending: Family Medicine | Admitting: Family Medicine

## 2020-05-14 ENCOUNTER — Other Ambulatory Visit: Payer: Self-pay

## 2020-05-14 DIAGNOSIS — Z1231 Encounter for screening mammogram for malignant neoplasm of breast: Secondary | ICD-10-CM | POA: Diagnosis not present

## 2020-05-16 DIAGNOSIS — Z1211 Encounter for screening for malignant neoplasm of colon: Secondary | ICD-10-CM | POA: Diagnosis not present

## 2020-06-17 ENCOUNTER — Ambulatory Visit: Payer: Medicare HMO | Admitting: Cardiovascular Disease

## 2020-06-24 ENCOUNTER — Other Ambulatory Visit: Payer: Self-pay

## 2020-06-24 ENCOUNTER — Encounter: Payer: Self-pay | Admitting: Cardiovascular Disease

## 2020-06-24 ENCOUNTER — Ambulatory Visit: Payer: Medicare HMO | Admitting: Cardiovascular Disease

## 2020-06-24 VITALS — BP 134/76 | HR 78 | Ht 67.0 in | Wt 166.4 lb

## 2020-06-24 DIAGNOSIS — R002 Palpitations: Secondary | ICD-10-CM

## 2020-06-24 NOTE — Patient Instructions (Signed)
Medication Instructions:  The current medical regimen is effective;  continue present plan and medications.  *If you need a refill on your cardiac medications before your next appointment, please call your pharmacy*    Follow-Up: At [facility], you and your health needs are our priority.  As part of our continuing mission to provide you with exceptional heart care, we have created designated Provider Care Teams.  These Care Teams include your primary Cardiologist (physician) and Advanced Practice Providers (APPs -  Physician Assistants and Nurse Practitioners) who all work together to provide you with the care you need, when you need it.  We recommend signing up for the patient portal called "MyChart".  Sign up information is provided on this After Visit Summary.  MyChart is used to connect with patients for Virtual Visits (Telemedicine).  Patients are able to view lab/test results, encounter notes, upcoming appointments, etc.  Non-urgent messages can be sent to your provider as well.   To learn more about what you can do with MyChart, go to [email or web address].    Your next appointment:   As needed  The format for your next appointment:   In Person  Provider:   Jonathan Berry, MD     

## 2020-06-24 NOTE — Progress Notes (Signed)
Mary Walsh returns today for follow-up of her noninvasive test for palpitations. Her event monitor was entirely unremarkable. Her 2D echo was normal as well. Her blood pressure is normal. Her LDL is 110, acceptable for primary prevention. She does exercise. I reassured her that there is nothing physiologic organic going on. Her palpitations have since resolved. I will see her back as needed.  Lorretta Harp, M.D., [place], [facility], [place] [address] [place], [place]  [address]  [phone] 06/24/2020 2:20 PM

## 2020-07-02 DIAGNOSIS — J069 Acute upper respiratory infection, unspecified: Secondary | ICD-10-CM | POA: Diagnosis not present

## 2020-07-02 DIAGNOSIS — Z20828 Contact with and (suspected) exposure to other viral communicable diseases: Secondary | ICD-10-CM | POA: Diagnosis not present

## 2020-07-16 DIAGNOSIS — T50Z95D Adverse effect of other vaccines and biological substances, subsequent encounter: Secondary | ICD-10-CM | POA: Diagnosis not present

## 2020-07-16 DIAGNOSIS — T781XXA Other adverse food reactions, not elsewhere classified, initial encounter: Secondary | ICD-10-CM | POA: Diagnosis not present

## 2020-07-16 DIAGNOSIS — T7840XD Allergy, unspecified, subsequent encounter: Secondary | ICD-10-CM | POA: Diagnosis not present

## 2020-07-16 DIAGNOSIS — T781XXD Other adverse food reactions, not elsewhere classified, subsequent encounter: Secondary | ICD-10-CM | POA: Diagnosis not present

## 2020-07-16 DIAGNOSIS — R61 Generalized hyperhidrosis: Secondary | ICD-10-CM | POA: Diagnosis not present

## 2020-07-16 DIAGNOSIS — R112 Nausea with vomiting, unspecified: Secondary | ICD-10-CM | POA: Diagnosis not present

## 2020-08-26 DIAGNOSIS — Z0100 Encounter for examination of eyes and vision without abnormal findings: Secondary | ICD-10-CM | POA: Diagnosis not present

## 2020-08-26 DIAGNOSIS — H5203 Hypermetropia, bilateral: Secondary | ICD-10-CM | POA: Diagnosis not present

## 2020-09-12 DIAGNOSIS — R059 Cough, unspecified: Secondary | ICD-10-CM | POA: Diagnosis not present

## 2020-09-12 DIAGNOSIS — U071 COVID-19: Secondary | ICD-10-CM | POA: Diagnosis not present

## 2020-09-12 DIAGNOSIS — R509 Fever, unspecified: Secondary | ICD-10-CM | POA: Diagnosis not present

## 2020-09-12 DIAGNOSIS — Z20828 Contact with and (suspected) exposure to other viral communicable diseases: Secondary | ICD-10-CM | POA: Diagnosis not present

## 2020-09-12 DIAGNOSIS — R0981 Nasal congestion: Secondary | ICD-10-CM | POA: Diagnosis not present

## 2020-09-22 ENCOUNTER — Other Ambulatory Visit: Payer: Self-pay | Admitting: Family Medicine

## 2020-09-22 ENCOUNTER — Ambulatory Visit
Admission: RE | Admit: 2020-09-22 | Discharge: 2020-09-22 | Disposition: A | Discharge status: Home / Self Care | Payer: Medicare HMO | Source: Ambulatory Visit | Attending: Family Medicine | Admitting: Family Medicine

## 2020-09-22 ENCOUNTER — Other Ambulatory Visit: Payer: Self-pay

## 2020-09-22 DIAGNOSIS — R058 Other specified cough: Secondary | ICD-10-CM

## 2020-09-22 DIAGNOSIS — U071 COVID-19: Secondary | ICD-10-CM | POA: Diagnosis not present

## 2020-09-22 DIAGNOSIS — R059 Cough, unspecified: Secondary | ICD-10-CM | POA: Diagnosis not present

## 2020-12-02 DIAGNOSIS — Z0000 Encounter for general adult medical examination without abnormal findings: Secondary | ICD-10-CM | POA: Diagnosis not present

## 2020-12-02 DIAGNOSIS — M8588 Other specified disorders of bone density and structure, other site: Secondary | ICD-10-CM | POA: Diagnosis not present

## 2020-12-02 DIAGNOSIS — H6981 Other specified disorders of Eustachian tube, right ear: Secondary | ICD-10-CM | POA: Diagnosis not present

## 2020-12-02 DIAGNOSIS — E785 Hyperlipidemia, unspecified: Secondary | ICD-10-CM | POA: Diagnosis not present

## 2020-12-02 DIAGNOSIS — E559 Vitamin D deficiency, unspecified: Secondary | ICD-10-CM | POA: Diagnosis not present

## 2020-12-02 DIAGNOSIS — Z1211 Encounter for screening for malignant neoplasm of colon: Secondary | ICD-10-CM | POA: Diagnosis not present

## 2020-12-02 DIAGNOSIS — R7303 Prediabetes: Secondary | ICD-10-CM | POA: Diagnosis not present

## 2020-12-02 DIAGNOSIS — L989 Disorder of the skin and subcutaneous tissue, unspecified: Secondary | ICD-10-CM | POA: Diagnosis not present

## 2020-12-04 ENCOUNTER — Other Ambulatory Visit: Payer: Self-pay | Admitting: Family Medicine

## 2020-12-04 DIAGNOSIS — M8580 Other specified disorders of bone density and structure, unspecified site: Secondary | ICD-10-CM

## 2020-12-04 DIAGNOSIS — Z1231 Encounter for screening mammogram for malignant neoplasm of breast: Secondary | ICD-10-CM

## 2020-12-27 DIAGNOSIS — Z809 Family history of malignant neoplasm, unspecified: Secondary | ICD-10-CM | POA: Diagnosis not present

## 2020-12-27 DIAGNOSIS — Z823 Family history of stroke: Secondary | ICD-10-CM | POA: Diagnosis not present

## 2020-12-27 DIAGNOSIS — Z79890 Hormone replacement therapy (postmenopausal): Secondary | ICD-10-CM | POA: Diagnosis not present

## 2020-12-27 DIAGNOSIS — R69 Illness, unspecified: Secondary | ICD-10-CM | POA: Diagnosis not present

## 2020-12-27 DIAGNOSIS — Z87440 Personal history of urinary (tract) infections: Secondary | ICD-10-CM | POA: Diagnosis not present

## 2020-12-27 DIAGNOSIS — R030 Elevated blood-pressure reading, without diagnosis of hypertension: Secondary | ICD-10-CM | POA: Diagnosis not present

## 2020-12-27 DIAGNOSIS — Z8249 Family history of ischemic heart disease and other diseases of the circulatory system: Secondary | ICD-10-CM | POA: Diagnosis not present

## 2020-12-30 DIAGNOSIS — H9201 Otalgia, right ear: Secondary | ICD-10-CM | POA: Diagnosis not present

## 2020-12-30 DIAGNOSIS — H938X3 Other specified disorders of ear, bilateral: Secondary | ICD-10-CM | POA: Diagnosis not present

## 2020-12-30 DIAGNOSIS — M2669 Other specified disorders of temporomandibular joint: Secondary | ICD-10-CM | POA: Diagnosis not present

## 2020-12-30 DIAGNOSIS — J342 Deviated nasal septum: Secondary | ICD-10-CM | POA: Diagnosis not present

## 2021-03-09 DIAGNOSIS — Q825 Congenital non-neoplastic nevus: Secondary | ICD-10-CM | POA: Diagnosis not present

## 2021-03-09 DIAGNOSIS — D225 Melanocytic nevi of trunk: Secondary | ICD-10-CM | POA: Diagnosis not present

## 2021-03-09 DIAGNOSIS — L570 Actinic keratosis: Secondary | ICD-10-CM | POA: Diagnosis not present

## 2021-03-09 DIAGNOSIS — L661 Lichen planopilaris: Secondary | ICD-10-CM | POA: Diagnosis not present

## 2021-04-20 DIAGNOSIS — Z1211 Encounter for screening for malignant neoplasm of colon: Secondary | ICD-10-CM | POA: Diagnosis not present

## 2021-05-27 ENCOUNTER — Other Ambulatory Visit: Payer: Self-pay

## 2021-05-27 ENCOUNTER — Ambulatory Visit
Admission: RE | Admit: 2021-05-27 | Discharge: 2021-05-27 | Disposition: A | Discharge status: Home / Self Care | Payer: Medicare HMO | Source: Ambulatory Visit | Attending: Family Medicine | Admitting: Family Medicine

## 2021-05-27 DIAGNOSIS — Z1231 Encounter for screening mammogram for malignant neoplasm of breast: Secondary | ICD-10-CM

## 2021-05-27 DIAGNOSIS — M8580 Other specified disorders of bone density and structure, unspecified site: Secondary | ICD-10-CM

## 2021-05-27 DIAGNOSIS — M8589 Other specified disorders of bone density and structure, multiple sites: Secondary | ICD-10-CM | POA: Diagnosis not present

## 2021-06-03 ENCOUNTER — Other Ambulatory Visit: Payer: Self-pay | Admitting: Family Medicine

## 2021-06-03 DIAGNOSIS — R928 Other abnormal and inconclusive findings on diagnostic imaging of breast: Secondary | ICD-10-CM

## 2021-06-17 ENCOUNTER — Ambulatory Visit
Admission: RE | Admit: 2021-06-17 | Discharge: 2021-06-17 | Disposition: A | Discharge status: Home / Self Care | Payer: Medicare HMO | Source: Ambulatory Visit | Attending: Family Medicine | Admitting: Family Medicine

## 2021-06-17 ENCOUNTER — Other Ambulatory Visit: Payer: Self-pay

## 2021-06-17 DIAGNOSIS — R928 Other abnormal and inconclusive findings on diagnostic imaging of breast: Secondary | ICD-10-CM

## 2021-06-17 DIAGNOSIS — R922 Inconclusive mammogram: Secondary | ICD-10-CM | POA: Diagnosis not present

## 2021-07-01 DIAGNOSIS — U071 COVID-19: Secondary | ICD-10-CM | POA: Diagnosis not present

## 2021-07-01 DIAGNOSIS — B349 Viral infection, unspecified: Secondary | ICD-10-CM | POA: Diagnosis not present

## 2021-10-05 DIAGNOSIS — R0781 Pleurodynia: Secondary | ICD-10-CM | POA: Diagnosis not present

## 2021-10-13 DIAGNOSIS — L661 Lichen planopilaris: Secondary | ICD-10-CM | POA: Diagnosis not present

## 2021-10-20 DIAGNOSIS — Z8249 Family history of ischemic heart disease and other diseases of the circulatory system: Secondary | ICD-10-CM | POA: Diagnosis not present

## 2021-10-20 DIAGNOSIS — Z7722 Contact with and (suspected) exposure to environmental tobacco smoke (acute) (chronic): Secondary | ICD-10-CM | POA: Diagnosis not present

## 2021-10-20 DIAGNOSIS — Z809 Family history of malignant neoplasm, unspecified: Secondary | ICD-10-CM | POA: Diagnosis not present

## 2021-10-20 DIAGNOSIS — Z604 Social exclusion and rejection: Secondary | ICD-10-CM | POA: Diagnosis not present

## 2021-10-20 DIAGNOSIS — R69 Illness, unspecified: Secondary | ICD-10-CM | POA: Diagnosis not present

## 2021-10-20 DIAGNOSIS — Z823 Family history of stroke: Secondary | ICD-10-CM | POA: Diagnosis not present

## 2021-10-20 DIAGNOSIS — F324 Major depressive disorder, single episode, in partial remission: Secondary | ICD-10-CM | POA: Diagnosis not present

## 2021-10-20 DIAGNOSIS — Z79890 Hormone replacement therapy (postmenopausal): Secondary | ICD-10-CM | POA: Diagnosis not present

## 2021-10-20 DIAGNOSIS — R030 Elevated blood-pressure reading, without diagnosis of hypertension: Secondary | ICD-10-CM | POA: Diagnosis not present

## 2021-10-20 DIAGNOSIS — N951 Menopausal and female climacteric states: Secondary | ICD-10-CM | POA: Diagnosis not present

## 2021-12-08 DIAGNOSIS — R7303 Prediabetes: Secondary | ICD-10-CM | POA: Diagnosis not present

## 2021-12-08 DIAGNOSIS — E785 Hyperlipidemia, unspecified: Secondary | ICD-10-CM | POA: Diagnosis not present

## 2021-12-08 DIAGNOSIS — M8588 Other specified disorders of bone density and structure, other site: Secondary | ICD-10-CM | POA: Diagnosis not present

## 2021-12-08 DIAGNOSIS — E559 Vitamin D deficiency, unspecified: Secondary | ICD-10-CM | POA: Diagnosis not present

## 2021-12-08 DIAGNOSIS — Z1211 Encounter for screening for malignant neoplasm of colon: Secondary | ICD-10-CM | POA: Diagnosis not present

## 2021-12-08 DIAGNOSIS — Z0000 Encounter for general adult medical examination without abnormal findings: Secondary | ICD-10-CM | POA: Diagnosis not present

## 2022-02-28 ENCOUNTER — Emergency Department (HOSPITAL_BASED_OUTPATIENT_CLINIC_OR_DEPARTMENT_OTHER)
Admission: EM | Admit: 2022-02-28 | Discharge: 2022-02-28 | Disposition: A | Discharge status: Home / Self Care | Payer: Medicare HMO | Attending: Emergency Medicine | Admitting: Emergency Medicine

## 2022-02-28 ENCOUNTER — Encounter (HOSPITAL_BASED_OUTPATIENT_CLINIC_OR_DEPARTMENT_OTHER): Payer: Self-pay | Admitting: Emergency Medicine

## 2022-02-28 ENCOUNTER — Emergency Department (HOSPITAL_BASED_OUTPATIENT_CLINIC_OR_DEPARTMENT_OTHER): Payer: Medicare HMO

## 2022-02-28 ENCOUNTER — Other Ambulatory Visit: Payer: Self-pay

## 2022-02-28 DIAGNOSIS — I700 Atherosclerosis of aorta: Secondary | ICD-10-CM | POA: Diagnosis not present

## 2022-02-28 DIAGNOSIS — K8000 Calculus of gallbladder with acute cholecystitis without obstruction: Secondary | ICD-10-CM | POA: Insufficient documentation

## 2022-02-28 DIAGNOSIS — D259 Leiomyoma of uterus, unspecified: Secondary | ICD-10-CM | POA: Diagnosis not present

## 2022-02-28 DIAGNOSIS — K819 Cholecystitis, unspecified: Secondary | ICD-10-CM | POA: Diagnosis not present

## 2022-02-28 DIAGNOSIS — K8020 Calculus of gallbladder without cholecystitis without obstruction: Secondary | ICD-10-CM | POA: Diagnosis not present

## 2022-02-28 DIAGNOSIS — N200 Calculus of kidney: Secondary | ICD-10-CM | POA: Diagnosis not present

## 2022-02-28 DIAGNOSIS — K828 Other specified diseases of gallbladder: Secondary | ICD-10-CM | POA: Diagnosis not present

## 2022-02-28 DIAGNOSIS — R1011 Right upper quadrant pain: Secondary | ICD-10-CM | POA: Diagnosis not present

## 2022-02-28 DIAGNOSIS — R1013 Epigastric pain: Secondary | ICD-10-CM | POA: Diagnosis not present

## 2022-02-28 LAB — CBC WITH DIFFERENTIAL/PLATELET
Abs Immature Granulocytes: 0.03 10*3/uL (ref 0.00–0.07)
Basophils Absolute: 0 10*3/uL (ref 0.0–0.1)
Basophils Relative: 0 %
Eosinophils Absolute: 0 10*3/uL (ref 0.0–0.5)
Eosinophils Relative: 0 %
HCT: 37.5 % (ref 36.0–46.0)
Hemoglobin: 12.4 g/dL (ref 12.0–15.0)
Immature Granulocytes: 0 %
Lymphocytes Relative: 14 %
Lymphs Abs: 1.2 10*3/uL (ref 0.7–4.0)
MCH: 29.3 pg (ref 26.0–34.0)
MCHC: 33.1 g/dL (ref 30.0–36.0)
MCV: 88.7 fL (ref 80.0–100.0)
Monocytes Absolute: 0.5 10*3/uL (ref 0.1–1.0)
Monocytes Relative: 6 %
Neutro Abs: 7.2 10*3/uL (ref 1.7–7.7)
Neutrophils Relative %: 80 %
Platelets: 199 10*3/uL (ref 150–400)
RBC: 4.23 MIL/uL (ref 3.87–5.11)
RDW: 12.7 % (ref 11.5–15.5)
WBC: 9 10*3/uL (ref 4.0–10.5)
nRBC: 0 % (ref 0.0–0.2)

## 2022-02-28 LAB — COMPREHENSIVE METABOLIC PANEL
ALT: 14 U/L (ref 0–44)
AST: 19 U/L (ref 15–41)
Albumin: 3.7 g/dL (ref 3.5–5.0)
Alkaline Phosphatase: 63 U/L (ref 38–126)
Anion gap: 9 (ref 5–15)
BUN: 17 mg/dL (ref 8–23)
CO2: 25 mmol/L (ref 22–32)
Calcium: 9.3 mg/dL (ref 8.9–10.3)
Chloride: 104 mmol/L (ref 98–111)
Creatinine, Ser: 0.66 mg/dL (ref 0.44–1.00)
GFR, Estimated: >60 mL/min (ref >60)
Glucose, Bld: 100 mg/dL — ABNORMAL HIGH (ref 70–99)
Potassium: 4.2 mmol/L (ref 3.5–5.1)
Sodium: 138 mmol/L (ref 135–145)
Total Bilirubin: 0.6 mg/dL (ref 0.3–1.2)
Total Protein: 7 g/dL (ref 6.5–8.1)

## 2022-02-28 LAB — URINALYSIS, ROUTINE W REFLEX MICROSCOPIC
Bilirubin Urine: NEGATIVE
Glucose, UA: NEGATIVE mg/dL
Hgb urine dipstick: NEGATIVE
Ketones, ur: 40 mg/dL — AB
Leukocytes,Ua: NEGATIVE
Nitrite: NEGATIVE
Protein, ur: NEGATIVE mg/dL
Specific Gravity, Urine: 1.02 (ref 1.005–1.030)
pH: 5.5 (ref 5.0–8.0)

## 2022-02-28 LAB — LIPASE, BLOOD: Lipase: 24 U/L (ref 11–51)

## 2022-02-28 MED ORDER — LACTATED RINGERS IV BOLUS
1000.0000 mL | Freq: Once | INTRAVENOUS | Status: AC
  Administered 2022-02-28: 1000 mL via INTRAVENOUS

## 2022-02-28 MED ORDER — HYDROCODONE-ACETAMINOPHEN 5-325 MG PO TABS: 1.0000 | ORAL_TABLET | ORAL | 0 refills | Status: DC | PRN

## 2022-02-28 MED ORDER — IOHEXOL 300 MG/ML  SOLN
100.0000 mL | Freq: Once | INTRAMUSCULAR | Status: AC | PRN
  Administered 2022-02-28: 100 mL via INTRATHECAL

## 2022-02-28 MED ORDER — MORPHINE SULFATE (PF) 2 MG/ML IV SOLN
2.0000 mg | Freq: Once | INTRAVENOUS | Status: AC
  Administered 2022-02-28: 2 mg via INTRAVENOUS
  Filled 2022-02-28: qty 1

## 2022-02-28 NOTE — ED Notes (Signed)
Pt transported to CT ?

## 2022-02-28 NOTE — ED Notes (Signed)
ED Provider at bedside. 

## 2022-02-28 NOTE — ED Triage Notes (Signed)
Pt c/o possible food poison onset Sunday. Pt thought she was getting better until after she ate yesterday. Pt c/o upper abdominal pain, decreased PO intake and decreased urination.

## 2022-02-28 NOTE — Progress Notes (Addendum)
General Surgery  I was contacted via phone by ED provider at [facility] regarding this patient. I was able to remotely review her imaging. CT was suspicious for cholecystitis, however on my review of her [place] there are gallstones but there does not appear to be significant pericholecystic fluid or wall thickening. No large stone in the neck of the gallbladder. I am not able to examine patient but per EDP her symptoms have improved and patient would like to go home. Labs including WBC, LFTs and lipase are normal. I offered transfer to [facility] for admission and cholecystectomy given concern for cholecystitis on final [place] report. Per EDP, patient prefers discharge and outpatient follow up since she is feeling better. I feel this is reasonable given absence of fevers and leukocytosis, as long as patient has strict return precautions if pain recurs, at which time she should be admitted for cholecystectomy.  We will arrange outpatient follow up in clinic to discuss elective cholecystectomy.

## 2022-02-28 NOTE — Discharge Instructions (Signed)
Call Dr. Ayesha Rumpf office tomorrow morning.  Your medication should be at the pharmacy

## 2022-02-28 NOTE — ED Provider Notes (Signed)
[facility] Provider Note   CSN: [id number] Arrival date & time: 02/28/22  9924     History No chief complaint on file.   Mary Walsh is a 71 y.o. female with no known past medical history presenting today with the complaint of abdominal pain, decreased appetite and diarrhea.  She reports that last Sunday she went out to eat at a new [employer].  Later that evening she started to experience NVD.  Nobody else got sick.  Her appetite was decreased and on Wednesday she started to spike fevers up to 100.5.  On Thursday her temperature went down but she was achy all over with epigastric abdominal pain.  On Friday she was able to nibble on some food which helped her feel better but on Saturday she tried to eat and had severe epigastric/RUQ pain.  Had a large episode of diarrhea on Wednesday and has not had a bowel movement since.  Decreased urination.  She called the nurse associated with her health insurance I told her to come here for further evaluation.  Currently endorsing dysuria, no hematuria, no vaginal symptoms.  Pain is a 5  HPI     Home Medications Prior to Admission medications   Medication Sig Start Date End Date Taking? Authorizing Provider  estradiol (ESTRACE) 0.1 MG/GM vaginal cream Place 1 Applicatorful vaginally at bedtime.    [provider]  progesterone (PROMETRIUM) 100 MG capsule Take by mouth. 06/24/20   [provider]      Allergies    Codeine    Review of Systems   Review of Systems  Physical Exam Updated Vital Signs BP 133/79 (BP Location: Left Arm)   Pulse 80   Temp 98 F (36.7 C) (Oral)   Resp 16   Ht '5\' 6"'$  (1.676 m)   Wt 67.1 kg   SpO2 100%   BMI 23.89 kg/m  Physical Exam Vitals and nursing note reviewed.  Constitutional:      General: She is not in acute distress.    Appearance: Normal appearance. She is not ill-appearing.  HENT:     Head: Normocephalic and atraumatic.      Mouth/Throat:     Mouth: Mucous membranes are moist.  Eyes:     General: No scleral icterus.    Conjunctiva/sclera: Conjunctivae normal.  Pulmonary:     Effort: Pulmonary effort is normal. No respiratory distress.  Abdominal:     Tenderness: There is abdominal tenderness in the right upper quadrant and right lower quadrant. Positive signs include Murphy's sign.     Hernia: No hernia is present.  Skin:    General: Skin is warm and dry.     Findings: No rash.  Neurological:     Mental Status: She is alert.  Psychiatric:        Mood and Affect: Mood normal.    ED Results / Procedures / Treatments   Labs (all labs ordered are listed, but only abnormal results are displayed) Labs Reviewed - No data to display  EKG None  Radiology CT ABDOMEN PELVIS W CONTRAST  Result Date: 02/28/2022 CLINICAL DATA:  72 year old female with acute abdominal and pelvic pain. EXAM: CT ABDOMEN AND PELVIS WITH CONTRAST TECHNIQUE: Multidetector CT imaging of the abdomen and pelvis was performed using the standard protocol following bolus administration of intravenous contrast. RADIATION DOSE REDUCTION: This exam was performed according to the departmental dose-optimization program which includes automated exposure control, adjustment of the mA and/or kV according to  patient size and/or use of iterative reconstruction technique. CONTRAST:  1103m OMNIPAQUE IOHEXOL 300 MG/ML  SOLN COMPARISON:  None Available. FINDINGS: Lower chest: No acute abnormality Hepatobiliary: Cholelithiasis, gallbladder wall thickening and mild pericholecystic inflammation is highly suspicious for acute cholecystitis. Mild intrahepatic biliary fullness is noted without CBD dilatation. There appear to be small gallstones in the cystic duct. The liver is otherwise unremarkable. Pancreas: Unremarkable Spleen: Unremarkable Adrenals/Urinary Tract: The kidneys, adrenal glands and bladder are unremarkable except for nonobstructing 3 mm then 5 mm mid  LEFT renal calculi. Stomach/Bowel: Stomach is within normal limits. Appendix appears normal. No evidence of bowel wall thickening, distention, or inflammatory changes. Vascular/Lymphatic: Aortic atherosclerosis. No enlarged abdominal or pelvic lymph nodes. Reproductive: Uterus and bilateral adnexa are unremarkable except for small uterine fibroids. Other: No ascites, focal collection or pneumoperitoneum. Musculoskeletal: No acute or suspicious bony abnormalities are noted. Mild-to-moderate degenerative disc disease/spondylosis at L5-S1 noted IMPRESSION: 1. Cholelithiasis with gallbladder wall thickening and mild pericholecystic inflammation highly suspicious for acute cholecystitis. Mild intrahepatic biliary fullness without CBD dilatation. 2. Nonobstructing LEFT renal calculi. 3. Small uterine fibroids. 4. Aortic Atherosclerosis (ICD10-I70.0). Electronically Signed   By: JMargarette CanadaM.D.   On: 02/28/2022 12:19    Procedures Procedures   Medications Ordered in ED Medications  lactated ringers bolus 1,000 mL (0 mLs Intravenous Stopped 02/28/22 1212)  morphine (PF) 2 MG/ML injection 2 mg (2 mg Intravenous Given 02/28/22 1056)  iohexol (OMNIPAQUE) 300 MG/ML solution 100 mL (100 mLs Intrathecal Contrast Given 02/28/22 1147)    ED Course/ Medical Decision Making/ A&P                           Medical Decision Making Amount and/or Complexity of Data Reviewed Labs: ordered. Radiology: ordered.  Risk Prescription drug management.   This patient presents to the ED for concern of abdominal pain that has been going on for a week.  Differential for this patient includes gastroenteritis, cholecystitis, appendicitis, PUD, gastritis, pancreatitis.   This is not an exhaustive differential.    Past Medical History / Co-morbidities / Social History: None.  Non-smoker, no alcohol use or NSAID use    Physical Exam: Physical exam performed. The pertinent findings include: Severe right lower quadrant and right  upper quadrant tenderness.  We will pursue CT imaging instead of RUQ ultrasound to image the appendix.  RUQ read of cholecystitis.   Lab Tests: I ordered, and personally interpreted labs.  The pertinent results include:  -Normal white blood cell   Imaging Studies: I ordered imaging studies including CT abdomen pelvis. I independently visualized and interpreted imaging which showed acute chole. I agree with the radiologist interpretation.  RUQ ordered for better evaluation.   Medications: I ordered medication including fluids and morphine.  Not currently nauseated. Reevaluation of the patient after these medicines showed that the patient improved. I have reviewed the patients home medicines and have made adjustments as needed.   Consultations Obtained: I requested consultation with the general surgeon, Dr. AZenia Resides  and discussed lab and imaging findings as well as pertinent plan.  We discussed that the patient is afebrile, not tachycardic and without a white blood cell count.  Patient would prefer to go home and follow-up outpatient.  Dr. AZenia Residessays that this is reasonable but that we must enforce that the patient needs to return with any worsening symptoms.   Disposition: 71year old female presenting with 1 week worth of abdominal pain.  Worse with eating.  Started after eating [place] food last week.  No gallstones on CT or ultrasound however with borderline cholecystitis.  Patient was offered admission however she says she would prefer to go home and read about gallbladder disease and surgery and follow-up with general surgery tomorrow.  This is reasonable.  Return precautions discussed.  Given information on gallbladder diet and discharged in ambulatory condition with Vicodin.  Final Clinical Impression(s) / ED Diagnoses Final diagnoses:  Cholecystitis    Rx / DC Orders ED Discharge Orders          Ordered    HYDROcodone-acetaminophen (NORCO/VICODIN) 5-325 MG tablet  Every 4 hours  PRN        02/28/22 1338           Results and diagnoses were explained to the patient. Return precautions discussed in full. Patient had no additional questions and expressed complete understanding.   This chart was dictated using voice recognition software.  Despite best efforts to proofread,  errors can occur which can change the documentation meaning.    Darliss Ridgel 02/28/22 [place], Ankit, MD 03/01/22 1552

## 2022-03-05 ENCOUNTER — Ambulatory Visit: Payer: Self-pay | Admitting: Surgery

## 2022-03-05 DIAGNOSIS — K8020 Calculus of gallbladder without cholecystitis without obstruction: Secondary | ICD-10-CM | POA: Diagnosis not present

## 2022-03-05 NOTE — H&P (Signed)
History of Present Illness: Mary Walsh is a 71 y.o. female who was referred to me for evaluation of gallstones. About 2 weeks ago she developed bloating and RUQ discomfort after eating, as well as diarrhea. Over the next week her symptoms persisted, and she had low grade fevers. She went to the ED at [place] five days ago, and a CT scan was shows for cholecystitis.  She then had an ultrasound, which showed mild gallbladder wall thickening and gallstones.  She was offered transfer to [facility] for admission and urgent cholecystectomy, however declined this.  At the time she was afebrile with a normal WBC and LFTs.  She opted to go home with outpatient follow-up.  Today, she says she has continued to have right upper quadrant discomfort after any oral intake.  She has limited her p.o. intake to fruits and bland foods.  She appears comfortable today and is afebrile.   Is otherwise in good health and has never had any surgeries before.     Review of Systems: A complete review of systems was obtained from the patient.  I have reviewed this information and discussed as appropriate with the patient.  See HPI as well for other ROS.       Medical History: Past Medical History Past Medical History: Diagnosis Date  Anxiety    Arthritis    GERD (gastroesophageal reflux disease)        Patient Active Problem List Diagnosis  Calculus of gallbladder without cholecystitis without obstruction     Past Surgical History History reviewed. No pertinent surgical history.     Allergies Allergies Allergen Reactions  Sulfites Vomiting  Codeine Anxiety      Current Outpatient Medications on File Prior to Visit Medication Sig Dispense Refill  estradioL (ESTRACE) 0.01 % (0.1 mg/gram) vaginal cream Place vaginally      progesterone (PROMETRIUM) 100 MG capsule TAKE 2 CAPSULES BY MOUTH EVERYDAY AT BEDTIME       No current facility-administered medications on file prior to visit.     Family  History History reviewed. No pertinent family history.     Social History   Tobacco Use Smoking Status Never Smokeless Tobacco Never     Social History Social History    Socioeconomic History  Marital status: Married Tobacco Use  Smoking status: Never  Smokeless tobacco: Never Substance and Sexual Activity  Alcohol use: Not Currently  Drug use: Never      Objective:     Vitals:   03/05/22 1657 BP: 112/70 Pulse: 75 Temp: 36.4 C (97.6 F) SpO2: (!) 90% Weight: 66.8 kg (147 lb 3.2 oz) Height: 167.6 cm ('5\' 6"'$ )   Body mass index is 23.76 kg/m.   Physical Exam Vitals reviewed.  Constitutional:      General: She is not in acute distress.    Appearance: Normal appearance.  HENT:     Head: Normocephalic and atraumatic.  Eyes:     General: No scleral icterus.    Conjunctiva/sclera: Conjunctivae normal.  Cardiovascular:     Rate and Rhythm: Normal rate and regular rhythm.  Pulmonary:     Effort: Pulmonary effort is normal. No respiratory distress.     Breath sounds: No wheezing.  Abdominal:     General: There is no distension.     Palpations: Abdomen is soft.     Tenderness: There is no abdominal tenderness.  Musculoskeletal:     Cervical back: Normal range of motion.  Neurological:     Mental  Status: She is alert.          Assessment and Plan: Diagnoses and all orders for this visit:   Calculus of gallbladder without cholecystitis without obstruction     This 71 year old female with persistent right upper quadrant abdominal pain after eating, as well as bloating and diarrhea.  I personally reviewed her labs and her imaging.  Her ultrasound shows cholelithiasis with mild wall thickening. Laparoscopic cholecystectomy was recommended. The details of this procedure were discussed with the patient, including the risks of bleeding, infection, bile leak, and <0.5% risk of common bile duct injury. The patient expressed understanding and agrees to proceed with  surgery.  Given the frequency of her symptoms, I will schedule her as soon as possible the week after next.  I am unfortunately out of town next week, but I counseled her that if she has any fevers, jaundice, or severe pain that was does not resolve within a few hours, she should go to the ED and may need an urgent cholecystectomy.  She will be scheduled for surgery on Tuesday, June 20.  Michaelle Birks, MD [facility] Surgery General, Hepatobiliary and Pancreatic Surgery 03/05/22 5:05 PM

## 2022-03-05 NOTE — H&P (View-Only) (Signed)
History of Present Illness: Mary Walsh is a 71 y.o. female who was referred to me for evaluation of gallstones. About 2 weeks ago she developed bloating and RUQ discomfort after eating, as well as diarrhea. Over the next week her symptoms persisted, and she had low grade fevers. She went to the ED at [place] five days ago, and a CT scan was shows for cholecystitis.  She then had an ultrasound, which showed mild gallbladder wall thickening and gallstones.  She was offered transfer to [facility] for admission and urgent cholecystectomy, however declined this.  At the time she was afebrile with a normal WBC and LFTs.  She opted to go home with outpatient follow-up.  Today, she says she has continued to have right upper quadrant discomfort after any oral intake.  She has limited her p.o. intake to fruits and bland foods.  She appears comfortable today and is afebrile.   Is otherwise in good health and has never had any surgeries before.     Review of Systems: A complete review of systems was obtained from the patient.  I have reviewed this information and discussed as appropriate with the patient.  See HPI as well for other ROS.       Medical History: Past Medical History Past Medical History: Diagnosis Date  Anxiety    Arthritis    GERD (gastroesophageal reflux disease)        Patient Active Problem List Diagnosis  Calculus of gallbladder without cholecystitis without obstruction     Past Surgical History History reviewed. No pertinent surgical history.     Allergies Allergies Allergen Reactions  Sulfites Vomiting  Codeine Anxiety      Current Outpatient Medications on File Prior to Visit Medication Sig Dispense Refill  estradioL (ESTRACE) 0.01 % (0.1 mg/gram) vaginal cream Place vaginally      progesterone (PROMETRIUM) 100 MG capsule TAKE 2 CAPSULES BY MOUTH EVERYDAY AT BEDTIME       No current facility-administered medications on file prior to visit.     Family  History History reviewed. No pertinent family history.     Social History   Tobacco Use Smoking Status Never Smokeless Tobacco Never     Social History Social History    Socioeconomic History  Marital status: Married Tobacco Use  Smoking status: Never  Smokeless tobacco: Never Substance and Sexual Activity  Alcohol use: Not Currently  Drug use: Never      Objective:     Vitals:   03/05/22 1657 BP: 112/70 Pulse: 75 Temp: 36.4 C (97.6 F) SpO2: (!) 90% Weight: 66.8 kg (147 lb 3.2 oz) Height: 167.6 cm ('5\' 6"'$ )   Body mass index is 23.76 kg/m.   Physical Exam Vitals reviewed.  Constitutional:      General: She is not in acute distress.    Appearance: Normal appearance.  HENT:     Head: Normocephalic and atraumatic.  Eyes:     General: No scleral icterus.    Conjunctiva/sclera: Conjunctivae normal.  Cardiovascular:     Rate and Rhythm: Normal rate and regular rhythm.  Pulmonary:     Effort: Pulmonary effort is normal. No respiratory distress.     Breath sounds: No wheezing.  Abdominal:     General: There is no distension.     Palpations: Abdomen is soft.     Tenderness: There is no abdominal tenderness.  Musculoskeletal:     Cervical back: Normal range of motion.  Neurological:     Mental  Status: She is alert.          Assessment and Plan: Diagnoses and all orders for this visit:   Calculus of gallbladder without cholecystitis without obstruction     This 71 year old female with persistent right upper quadrant abdominal pain after eating, as well as bloating and diarrhea.  I personally reviewed her labs and her imaging.  Her ultrasound shows cholelithiasis with mild wall thickening. Laparoscopic cholecystectomy was recommended. The details of this procedure were discussed with the patient, including the risks of bleeding, infection, bile leak, and <0.5% risk of common bile duct injury. The patient expressed understanding and agrees to proceed with  surgery.  Given the frequency of her symptoms, I will schedule her as soon as possible the week after next.  I am unfortunately out of town next week, but I counseled her that if she has any fevers, jaundice, or severe pain that was does not resolve within a few hours, she should go to the ED and may need an urgent cholecystectomy.  She will be scheduled for surgery on Tuesday, June 20.  Michaelle Birks, MD [facility] Surgery General, Hepatobiliary and Pancreatic Surgery 03/05/22 5:05 PM

## 2022-03-09 NOTE — Progress Notes (Addendum)
PCP - Harlan Stains, MD  Cardiologist - Saw Lorie Phenix, MD 06-04-20 for palpitations after vaccines  PPM/ICD -  Device Orders -  Rep Notified -   Chest x-ray -  EKG - 2021 Stress Test -  ECHO - 2021 Cardiac Cath -  LABS-cbc/diff, CMP 02-28-22 epic  Sleep Study -  CPAP -   Fasting Blood Sugar -  Checks Blood Sugar _____ times a day  Blood Thinner Instructions: Aspirin Instructions:  ERAS Protcol - PRE-SURGERY Ensure or G2-    COVID vaccine -no  Activity-- Able to walk a flight os stairs without SOB Anesthesia review:   Patient denies shortness of breath, fever, cough and chest pain at PAT appointment   All instructions explained to the patient, with a verbal understanding of the material. Patient agrees to go over the instructions while at home for a better understanding. Patient also instructed to self quarantine after being tested for COVID-19. The opportunity to ask questions was provided.

## 2022-03-09 NOTE — Patient Instructions (Addendum)
DUE TO COVID-19 ONLY TWO VISITORS  (aged 69 and older)  ARE ALLOWED TO COME WITH YOU AND STAY IN THE WAITING ROOM ONLY DURING PRE OP AND PROCEDURE.   **NO VISITORS ARE ALLOWED IN THE SHORT STAY AREA OR RECOVERY ROOM!!**    Your procedure is scheduled on: 03-16-22   Report to [facility] Main Entrance    Report to admitting at        [place]  AM   Call this number if you have problems the morning of surgery [phone]   Do not eat food :After Midnight.   After Midnight you may have the following liquids until __0430____ AM DAY OF SURGERY  then NOTHING BY MOUTH  Water Black Coffee (sugar ok, NO MILK/CREAM OR CREAMERS)  Tea (sugar ok, NO MILK/CREAM OR CREAMERS) regular and decaf                             Plain Jell-O (NO RED)                                           Fruit ices (not with fruit pulp, NO RED)                                     Popsicles (NO RED)                                                                  Juice: apple, WHITE grape, WHITE cranberry Sports drinks like Gatorade (NO RED) Clear broth(vegetable,chicken,beef)                      If you have questions, please contact your surgeon's office.   FOLLOW  ANY ADDITIONAL PRE OP INSTRUCTIONS YOU RECEIVED FROM YOUR SURGEON'S OFFICE!!!     Oral Hygiene is also important to reduce your risk of infection.                                    Remember - BRUSH YOUR TEETH THE MORNING OF SURGERY WITH YOUR REGULAR TOOTHPASTE   Do NOT smoke after Midnight   Take these medicines the morning of surgery with A SIP OF WATER: NONE  DO NOT TAKE ANY ORAL DIABETIC MEDICATIONS DAY OF YOUR SURGERY                                You may not have any metal on your body including hair pins, jewelry, and body piercing             Do not wear make-up, lotions, powders, perfumes/cologne, or deodorant  Do not wear nail polish including gel and S&S, artificial/acrylic nails, or any other type of covering on natural nails  including finger and toenails. If you have artificial nails, gel coating, etc. that needs to be removed by a nail salon please have this removed  prior to surgery or surgery may need to be canceled/ delayed if the surgeon/ anesthesia feels like they are unable to be safely monitored.   Do not shave  48 hours prior to surgery.                  Do not bring valuables to the hospital. [place].   Contacts, dentures or bridgework may not be worn into surgery.     DO NOT [place]. PHARMACY WILL DISPENSE MEDICATIONS LISTED ON YOUR MEDICATION LIST TO YOU DURING YOUR ADMISSION [place]!    Patients discharged on the day of surgery will not be allowed to drive home.  Someone NEEDS to stay with you for the first 24 hours after anesthesia.               Please read over the following fact sheets you were given: IF YOU HAVE QUESTIONS ABOUT YOUR PRE-OP INSTRUCTIONS PLEASE CALL [phone]     [facility] - Preparing for Surgery Before surgery, you can play an important role.  Because skin is not sterile, your skin needs to be as free of germs as possible.  You can reduce the number of germs on your skin by washing with CHG (chlorahexidine gluconate) soap before surgery.  CHG is an antiseptic cleaner which kills germs and bonds with the skin to continue killing germs even after washing. Please DO NOT use if you have an allergy to CHG or antibacterial soaps.  If your skin becomes reddened/irritated stop using the CHG and inform your nurse when you arrive at Short Stay. Do not shave (including legs and underarms) for at least 48 hours prior to the first CHG shower.  You may shave your face/neck. Please follow these instructions carefully:  1.  Shower with CHG Soap the night before surgery and the  morning of Surgery.  2.  If you choose to wash your hair, wash your hair first as usual with your  normal   shampoo.  3.  After you shampoo, rinse your hair and body thoroughly to remove the  shampoo.                           4.  Use CHG as you would any other liquid soap.  You can apply chg directly  to the skin and wash                       Gently with a scrungie or clean washcloth.  5.  Apply the CHG Soap to your body ONLY FROM THE NECK DOWN.   Do not use on face/ open                           Wound or open sores. Avoid contact with eyes, ears mouth and genitals (private parts).                       Wash face,  Genitals (private parts) with your normal soap.             6.  Wash thoroughly, paying special attention to the area where your surgery  will be performed.  7.  Thoroughly rinse your body with warm water from the neck down.  8.  DO NOT shower/wash with your normal soap after using and rinsing off  the CHG Soap.                9.  Pat yourself dry with a clean towel.            10.  Wear clean pajamas.            11.  Place clean sheets on your bed the night of your first shower and do not  sleep with pets. Day of Surgery : Do not apply any lotions/deodorants the morning of surgery.  Please wear clean clothes to the hospital/surgery center.  FAILURE TO FOLLOW THESE INSTRUCTIONS MAY RESULT IN THE CANCELLATION OF YOUR SURGERY PATIENT SIGNATURE_________________________________  NURSE SIGNATURE__________________________________  ________________________________________________________________________

## 2022-03-10 ENCOUNTER — Other Ambulatory Visit: Payer: Self-pay

## 2022-03-10 ENCOUNTER — Encounter (HOSPITAL_COMMUNITY)
Admission: RE | Admit: 2022-03-10 | Discharge: 2022-03-10 | Disposition: A | Discharge status: Home / Self Care | Payer: Medicare HMO | Source: Ambulatory Visit | Attending: Surgery | Admitting: Surgery

## 2022-03-10 ENCOUNTER — Encounter (HOSPITAL_COMMUNITY): Payer: Self-pay

## 2022-03-10 DIAGNOSIS — Z01818 Encounter for other preprocedural examination: Secondary | ICD-10-CM | POA: Insufficient documentation

## 2022-03-10 HISTORY — DX: Gastro-esophageal reflux disease without esophagitis: K21.9

## 2022-03-10 HISTORY — DX: Pneumonia, unspecified organism: J18.9

## 2022-03-10 HISTORY — DX: Headache, unspecified: R51.9

## 2022-03-15 NOTE — Anesthesia Preprocedure Evaluation (Signed)
Anesthesia Evaluation  Patient identified by MRN, date of birth, ID band Patient awake    Reviewed: Allergy & Precautions, NPO status , Patient's Chart, lab work & pertinent test results  Airway Mallampati: II  TM Distance: >3 FB Neck ROM: Full    Dental no notable dental hx. (+) Teeth Intact, Dental Advisory Given   Pulmonary    Pulmonary exam normal breath sounds clear to auscultation       Cardiovascular Exercise Tolerance: Good Normal cardiovascular exam Rhythm:Regular Rate:Normal     Neuro/Psych  Headaches,    GI/Hepatic Neg liver ROS, GERD  ,  Endo/Other    Renal/GU Lab Results      Component                Value               Date                      CREATININE               0.66                02/28/2022                K                        4.2                 02/28/2022                 Musculoskeletal   Abdominal   Peds  Hematology Lab Results      Component                Value               Date                      WBC                      9.0                 02/28/2022                HGB                      12.4                02/28/2022                HCT                      37.5                02/28/2022                 PLT                      199                 02/28/2022              Anesthesia Other Findings All: Codeine, sulfa  Reproductive/Obstetrics                            Anesthesia Physical Anesthesia Plan  ASA: 1  Anesthesia Plan: General   Post-op Pain Management: Tylenol PO (pre-op)* and Toradol IV (intra-op)*   Induction: Intravenous  PONV Risk Score and Plan: 3 and Treatment may vary due to age or medical condition, Midazolam and Ondansetron  Airway Management Planned: Oral ETT  Additional Equipment: None  Intra-op Plan:   Post-operative Plan: Extubation in OR  Informed Consent: I have reviewed the patients History and Physical,  chart, labs and discussed the procedure including the risks, benefits and alternatives for the proposed anesthesia with the patient or authorized representative who has indicated his/her understanding and acceptance.     Dental advisory given  Plan Discussed with: CRNA  Anesthesia Plan Comments:        Anesthesia Quick Evaluation

## 2022-03-16 ENCOUNTER — Encounter (HOSPITAL_COMMUNITY)
Admission: RE | Disposition: A | Discharge status: Home / Self Care | Payer: Self-pay | Source: Ambulatory Visit | Attending: Surgery

## 2022-03-16 ENCOUNTER — Ambulatory Visit (HOSPITAL_COMMUNITY): Payer: Medicare HMO | Admitting: Anesthesiology

## 2022-03-16 ENCOUNTER — Other Ambulatory Visit: Payer: Self-pay

## 2022-03-16 ENCOUNTER — Encounter (HOSPITAL_COMMUNITY): Payer: Self-pay | Admitting: Surgery

## 2022-03-16 ENCOUNTER — Ambulatory Visit (HOSPITAL_BASED_OUTPATIENT_CLINIC_OR_DEPARTMENT_OTHER): Payer: Medicare HMO | Admitting: Anesthesiology

## 2022-03-16 ENCOUNTER — Ambulatory Visit (HOSPITAL_COMMUNITY)
Admission: RE | Admit: 2022-03-16 | Discharge: 2022-03-16 | Disposition: A | Discharge status: Home / Self Care | Payer: Medicare HMO | Source: Ambulatory Visit | Attending: Surgery | Admitting: Surgery

## 2022-03-16 DIAGNOSIS — K8020 Calculus of gallbladder without cholecystitis without obstruction: Secondary | ICD-10-CM | POA: Diagnosis not present

## 2022-03-16 DIAGNOSIS — K8010 Calculus of gallbladder with chronic cholecystitis without obstruction: Secondary | ICD-10-CM

## 2022-03-16 HISTORY — PX: CHOLECYSTECTOMY: SHX55

## 2022-03-16 SURGERY — LAPAROSCOPIC CHOLECYSTECTOMY
Anesthesia: General

## 2022-03-16 MED ORDER — ONDANSETRON HCL 4 MG/2ML IJ SOLN: 4.0000 mg | Freq: Once | INTRAMUSCULAR | Status: DC | PRN

## 2022-03-16 MED ORDER — ROCURONIUM BROMIDE 10 MG/ML (PF) SYRINGE
PREFILLED_SYRINGE | INTRAVENOUS | Status: AC
  Filled 2022-03-16: qty 10

## 2022-03-16 MED ORDER — ACETAMINOPHEN 500 MG PO TABS: 1000.0000 mg | ORAL_TABLET | Freq: Once | ORAL | Status: DC

## 2022-03-16 MED ORDER — PROPOFOL 10 MG/ML IV BOLUS
INTRAVENOUS | Status: AC
  Filled 2022-03-16: qty 20

## 2022-03-16 MED ORDER — SUGAMMADEX SODIUM 200 MG/2ML IV SOLN
INTRAVENOUS | Status: DC | PRN
  Administered 2022-03-16: 200 mg via INTRAVENOUS

## 2022-03-16 MED ORDER — FENTANYL CITRATE (PF) 100 MCG/2ML IJ SOLN
INTRAMUSCULAR | Status: AC
  Filled 2022-03-16: qty 2

## 2022-03-16 MED ORDER — LACTATED RINGERS IV SOLN: INTRAVENOUS | Status: DC

## 2022-03-16 MED ORDER — LIDOCAINE HCL (PF) 2 % IJ SOLN
INTRAMUSCULAR | Status: AC
Start: 2022-03-16 — End: ?
  Filled 2022-03-16: qty 5

## 2022-03-16 MED ORDER — DEXAMETHASONE SODIUM PHOSPHATE 10 MG/ML IJ SOLN
INTRAMUSCULAR | Status: AC
  Filled 2022-03-16: qty 1

## 2022-03-16 MED ORDER — BUPIVACAINE-EPINEPHRINE 0.25% -1:200000 IJ SOLN
INTRAMUSCULAR | Status: DC | PRN
  Administered 2022-03-16: 30 mL

## 2022-03-16 MED ORDER — MIDAZOLAM HCL 2 MG/2ML IJ SOLN
INTRAMUSCULAR | Status: AC
  Filled 2022-03-16: qty 2

## 2022-03-16 MED ORDER — 0.9 % SODIUM CHLORIDE (POUR BTL) OPTIME
TOPICAL | Status: DC | PRN
  Administered 2022-03-16: 1000 mL

## 2022-03-16 MED ORDER — CHLORHEXIDINE GLUCONATE 0.12 % MT SOLN
15.0000 mL | Freq: Once | OROMUCOSAL | Status: AC
  Administered 2022-03-16: 15 mL via OROMUCOSAL

## 2022-03-16 MED ORDER — GABAPENTIN 300 MG PO CAPS
300.0000 mg | ORAL_CAPSULE | ORAL | Status: AC
  Administered 2022-03-16: 300 mg via ORAL
  Filled 2022-03-16: qty 1

## 2022-03-16 MED ORDER — MIDAZOLAM HCL 5 MG/5ML IJ SOLN
INTRAMUSCULAR | Status: DC | PRN
  Administered 2022-03-16: 2 mg via INTRAVENOUS

## 2022-03-16 MED ORDER — ORAL CARE MOUTH RINSE: 15.0000 mL | Freq: Once | OROMUCOSAL | Status: AC

## 2022-03-16 MED ORDER — CEFAZOLIN SODIUM-DEXTROSE 2-4 GM/100ML-% IV SOLN
2.0000 g | INTRAVENOUS | Status: AC
  Administered 2022-03-16: 2 g via INTRAVENOUS
  Filled 2022-03-16: qty 100

## 2022-03-16 MED ORDER — OXYCODONE HCL 5 MG PO TABS
5.0000 mg | ORAL_TABLET | Freq: Once | ORAL | Status: AC | PRN
  Administered 2022-03-16: 5 mg via ORAL

## 2022-03-16 MED ORDER — HYDROMORPHONE HCL 1 MG/ML IJ SOLN: 0.2500 mg | INTRAMUSCULAR | Status: DC | PRN

## 2022-03-16 MED ORDER — TRAMADOL HCL 50 MG PO TABS: 50.0000 mg | ORAL_TABLET | Freq: Four times a day (QID) | ORAL | 0 refills | Status: AC | PRN

## 2022-03-16 MED ORDER — FENTANYL CITRATE (PF) 100 MCG/2ML IJ SOLN
INTRAMUSCULAR | Status: DC | PRN
  Administered 2022-03-16 (×3): 50 ug via INTRAVENOUS

## 2022-03-16 MED ORDER — EPHEDRINE SULFATE-NACL 50-0.9 MG/10ML-% IV SOSY
PREFILLED_SYRINGE | INTRAVENOUS | Status: DC | PRN
  Administered 2022-03-16: 10 mg via INTRAVENOUS

## 2022-03-16 MED ORDER — KETOROLAC TROMETHAMINE 30 MG/ML IJ SOLN: 15.0000 mg | Freq: Once | INTRAMUSCULAR | Status: DC | PRN

## 2022-03-16 MED ORDER — DEXMEDETOMIDINE (PRECEDEX) IN NS 20 MCG/5ML (4 MCG/ML) IV SYRINGE
PREFILLED_SYRINGE | INTRAVENOUS | Status: DC | PRN
  Administered 2022-03-16: 8 ug via INTRAVENOUS

## 2022-03-16 MED ORDER — ROCURONIUM BROMIDE 10 MG/ML (PF) SYRINGE
PREFILLED_SYRINGE | INTRAVENOUS | Status: DC | PRN
  Administered 2022-03-16: 50 mg via INTRAVENOUS

## 2022-03-16 MED ORDER — DEXMEDETOMIDINE (PRECEDEX) IN NS 20 MCG/5ML (4 MCG/ML) IV SYRINGE
PREFILLED_SYRINGE | INTRAVENOUS | Status: AC
  Filled 2022-03-16: qty 5

## 2022-03-16 MED ORDER — LACTATED RINGERS IV SOLN
INTRAVENOUS | Status: DC | PRN
  Administered 2022-03-16: 1000 mL

## 2022-03-16 MED ORDER — ACETAMINOPHEN 500 MG PO TABS
1000.0000 mg | ORAL_TABLET | ORAL | Status: AC
  Administered 2022-03-16: 1000 mg via ORAL
  Filled 2022-03-16: qty 2

## 2022-03-16 MED ORDER — BUPIVACAINE-EPINEPHRINE (PF) 0.25% -1:200000 IJ SOLN
INTRAMUSCULAR | Status: AC
  Filled 2022-03-16: qty 30

## 2022-03-16 MED ORDER — PROPOFOL 10 MG/ML IV BOLUS
INTRAVENOUS | Status: DC | PRN
  Administered 2022-03-16: 80 mg via INTRAVENOUS

## 2022-03-16 MED ORDER — OXYCODONE HCL 5 MG PO TABS
ORAL_TABLET | ORAL | Status: AC
  Filled 2022-03-16: qty 1

## 2022-03-16 MED ORDER — DEXAMETHASONE SODIUM PHOSPHATE 10 MG/ML IJ SOLN
INTRAMUSCULAR | Status: DC | PRN
  Administered 2022-03-16: 10 mg via INTRAVENOUS

## 2022-03-16 MED ORDER — ONDANSETRON HCL 4 MG/2ML IJ SOLN
INTRAMUSCULAR | Status: DC | PRN
  Administered 2022-03-16: 4 mg via INTRAVENOUS

## 2022-03-16 MED ORDER — EPHEDRINE 5 MG/ML INJ
INTRAVENOUS | Status: AC
  Filled 2022-03-16: qty 5

## 2022-03-16 MED ORDER — OXYCODONE HCL 5 MG/5ML PO SOLN: 5.0000 mg | Freq: Once | ORAL | Status: AC | PRN

## 2022-03-16 MED ORDER — ONDANSETRON HCL 4 MG/2ML IJ SOLN
INTRAMUSCULAR | Status: AC
  Filled 2022-03-16: qty 2

## 2022-03-16 MED ORDER — LIDOCAINE HCL (CARDIAC) PF 100 MG/5ML IV SOSY
PREFILLED_SYRINGE | INTRAVENOUS | Status: DC | PRN
  Administered 2022-03-16: 80 mg via INTRAVENOUS

## 2022-03-16 SURGICAL SUPPLY — 47 items
APPLIER CLIP 5 13 M/L LIGAMAX5 (MISCELLANEOUS) ×2
BAG COUNTER SPONGE SURGICOUNT (BAG) ×1 IMPLANT
CHLORAPREP W/TINT 26 (MISCELLANEOUS) ×2 IMPLANT
CLIP APPLIE 5 13 M/L LIGAMAX5 (MISCELLANEOUS) ×1 IMPLANT
COVER MAYO STAND XLG (MISCELLANEOUS) ×2 IMPLANT
COVER SURGICAL LIGHT HANDLE (MISCELLANEOUS) ×2 IMPLANT
DERMABOND ADVANCED (GAUZE/BANDAGES/DRESSINGS) ×1
DERMABOND ADVANCED .7 DNX12 (GAUZE/BANDAGES/DRESSINGS) ×1 IMPLANT
DRAPE C-ARM 42X120 X-RAY (DRAPES) IMPLANT
ELECT L-HOOK LAP 45CM DISP (ELECTROSURGICAL)
ELECT PENCIL ROCKER SW 15FT (MISCELLANEOUS) ×2 IMPLANT
ELECT REM PT RETURN 15FT ADLT (MISCELLANEOUS) ×2 IMPLANT
ELECTRODE L-HOOK LAP 45CM DISP (ELECTROSURGICAL) IMPLANT
ENDOLOOP SUT PDS II  0 18 (SUTURE) ×2
ENDOLOOP SUT PDS II 0 18 (SUTURE) IMPLANT
GLOVE BIOGEL PI IND STRL 6 (GLOVE) ×1 IMPLANT
GLOVE BIOGEL PI INDICATOR 6 (GLOVE) ×1
GLOVE BIOGEL PI MICRO 5.5 (GLOVE) ×1
GLOVE BIOGEL PI MICRO STRL 5.5 (GLOVE) ×1 IMPLANT
GLOVE SS PI  5.5 STRL (GLOVE) ×2
GLOVE SS PI 5.5 STRL (GLOVE) ×1 IMPLANT
GOWN STRL REUS W/ TWL LRG LVL3 (GOWN DISPOSABLE) ×1 IMPLANT
GOWN STRL REUS W/TWL LRG LVL3 (GOWN DISPOSABLE) ×2
GRASPER SUT TROCAR 14GX15 (MISCELLANEOUS) IMPLANT
HEMOSTAT SNOW SURGICEL 2X4 (HEMOSTASIS) IMPLANT
IRRIG SUCT STRYKERFLOW 2 WTIP (MISCELLANEOUS) ×2
IRRIGATION SUCT STRKRFLW 2 WTP (MISCELLANEOUS) ×1 IMPLANT
KIT BASIN OR (CUSTOM PROCEDURE TRAY) ×2 IMPLANT
KIT TURNOVER KIT A (KITS) ×1 IMPLANT
L-HOOK LAP DISP 36CM (ELECTROSURGICAL) ×2
LHOOK LAP DISP 36CM (ELECTROSURGICAL) ×1 IMPLANT
NDL INSUFFLATION 14GA 120MM (NEEDLE) IMPLANT
NEEDLE INSUFFLATION 14GA 120MM (NEEDLE) IMPLANT
SCISSORS LAP 5X35 DISP (ENDOMECHANICALS) ×2 IMPLANT
SET CHOLANGIOGRAPH MIX (MISCELLANEOUS) IMPLANT
SET TUBE SMOKE EVAC HIGH FLOW (TUBING) ×2 IMPLANT
SLEEVE Z-THREAD 5X100MM (TROCAR) ×4 IMPLANT
SPIKE FLUID TRANSFER (MISCELLANEOUS) ×1 IMPLANT
SUT MNCRL AB 4-0 PS2 18 (SUTURE) ×2 IMPLANT
SYS BAG RETRIEVAL 10MM (BASKET) ×2
SYSTEM BAG RETRIEVAL 10MM (BASKET) ×1 IMPLANT
TOWEL OR 17X26 10 PK STRL BLUE (TOWEL DISPOSABLE) ×2 IMPLANT
TOWEL OR NON WOVEN STRL DISP B (DISPOSABLE) ×1 IMPLANT
TRAY LAPAROSCOPIC (CUSTOM PROCEDURE TRAY) ×2 IMPLANT
TROCAR ADV FIXATION 12X100MM (TROCAR) IMPLANT
TROCAR BALLN 12MMX100 BLUNT (TROCAR) IMPLANT
TROCAR Z-THREAD OPTICAL 5X100M (TROCAR) ×2 IMPLANT

## 2022-03-16 NOTE — Transfer of Care (Signed)
Immediate Anesthesia Transfer of Care Note  Patient: Mary Walsh  Procedure(s) Performed: LAPAROSCOPIC CHOLECYSTECTOMY  Patient Location: PACU  Anesthesia Type:General  Level of Consciousness: sedated  Airway & Oxygen Therapy: Patient Spontanous Breathing and Patient connected to face mask oxygen  Post-op Assessment: Report given to RN and Post -op Vital signs reviewed and stable  Post vital signs: Reviewed and stable  Last Vitals:  Vitals Value Taken Time  BP    Temp    Pulse 86 03/16/22 0849  Resp 13 03/16/22 0849  SpO2 100 % 03/16/22 0849  Vitals shown include unvalidated device data.  Last Pain:  Vitals:   03/16/22 0555  TempSrc: Oral  PainSc:          Complications: No notable events documented.

## 2022-03-16 NOTE — Discharge Instructions (Addendum)
[facility] [place] [facility] DISCHARGE INSTRUCTIONS  Activity No heavy lifting greater than 15 pounds for 4 weeks after surgery. Ok to shower in 24 hours, but do not bathe or submerge incisions underwater. Do not drive while taking narcotic pain medication.  Wound Care Your incisions are covered with skin glue called Dermabond. This will peel off on its own over time. You may shower and allow warm soapy water to run over your incisions. Gently pat dry. Do not submerge your incision underwater. Monitor your incision for any new redness, tenderness, or drainage.  When to Call us: Fever greater than 100.5 New redness, drainage, or swelling at incision site Severe pain, nausea, or vomiting Jaundice (yellowing of the whites of the eyes or skin)  Follow-up You have an appointment scheduled with Dr. Zenia Resides on April 02, 2022 at 9:30am. This will be at the [facility] Surgery office at [address] [place], [place], [place]. Please arrive at least 15 minutes prior to your scheduled appointment time.  For questions or concerns, please call the office at [phone].

## 2022-03-16 NOTE — Op Note (Signed)
Date: 03/16/22  Patient: Mary Walsh MRN: [id number]  Preoperative Diagnosis: Symptomatic cholelithiasis Postoperative Diagnosis: Symptomatic cholelithiasis, chronic cholecystitis  Procedure: Laparoscopic cholecystectomy  Surgeon: Michaelle Birks, MD Assistant: Armandina Gemma, MD  EBL: Minimal  Anesthesia: General endotracheal  Specimens: Gallbladder  Indications: [name] is a 71 yo female who presented to the ED several weeks acute with acute abdominal pain. RUQ US showed cholelithiasis with possible cholecystitis. She had a normal WBC and pain improved with symptomatic treatment, and she opted for outpatient follow up. She was evaluated in clinic and scheduled for elective cholecystectomy.  Findings: Gallbladder adhesions and obstruction consistent with acute on chronic cholecystitis.  Procedure details: Informed consent was obtained in the preoperative area prior to the procedure. The patient was brought to the operating room and placed on the table in the supine position. General anesthesia was induced and appropriate lines and drains were placed for intraoperative monitoring. Perioperative antibiotics were administered per SCIP guidelines. The abdomen was prepped and draped in the usual sterile fashion. A pre-procedure timeout was taken verifying patient identity, surgical site and procedure to be performed.  A small infraumbilical skin incision was made, the subcutaneous tissue was divided with cautery, and the umbilical stalk was grasped and elevated. The fascia was incised and the peritoneal cavity was directly visualized. A 39m Hassan trocar was placed and the abdomen was insufflated. The peritoneal cavity was inspected with no evidence of visceral or vascular injury. Three 559mports were placed in the right subcostal margin, all under direct visualization. The fundus of the gallbladder was grasped and retracted cephalad.  There were omental adhesions to the gallbladder which  were taken down using cautery and blunt dissection.  The infundibulum was retracted laterally. The cystic triangle was dissected out using cautery and blunt dissection, and the critical view of safety was obtained. The cystic duct and cystic artery were clipped and ligated.  The cystic duct was dilated, so the cystic duct stump was also closed with a PDS Endoloop.  There was drainage of clear nonbilious fluid from the gallbladder, consistent with obstruction.  The gallbladder was taken off the liver using cautery, and the specimen was placed in an endocatch bag. The surgical site was irrigated with saline until the effluent was clear. Hemostasis was achieved in the gallbladder fossa using cautery. The cystic duct and artery stumps were visually inspected and there was no evidence of bile leak or bleeding.  Three dropped stones were visualized and were removed.  The ports were removed under direct visualization and the abdomen was desufflated. The specimen was extracted via the umbilical port site. The umbilical port site fascia was closed with a 0 vicryl suture. The skin at all port sites was closed with 4-0 monocryl subcuticular suture. Dermabond was applied.  The patient tolerated the procedure well with no apparent complications.  All counts were correct x2 at the end of the procedure. The patient was extubated and taken to PACU in stable condition.  ShMichaelle BirksMD 03/16/22 8:44 AM

## 2022-03-16 NOTE — Anesthesia Procedure Notes (Signed)
Procedure Name: Intubation Date/Time: 03/16/2022 7:40 AM  Performed by: Lind Covert, CRNAPre-anesthesia Checklist: Patient identified, Emergency Drugs available, Suction available, Patient being monitored and Timeout performed Patient Re-evaluated:Patient Re-evaluated prior to induction Oxygen Delivery Method: Circle system utilized Preoxygenation: Pre-oxygenation with 100% oxygen Induction Type: IV induction Ventilation: Mask ventilation without difficulty Laryngoscope Size: Mac and 3 Grade View: Grade I Tube type: Oral Number of attempts: 1 Airway Equipment and Method: Stylet Placement Confirmation: ETT inserted through vocal cords under direct vision, positive ETCO2 and breath sounds checked- equal and bilateral Secured at: 21 cm Tube secured with: Tape Dental Injury: Teeth and Oropharynx as per pre-operative assessment

## 2022-03-16 NOTE — Anesthesia Postprocedure Evaluation (Signed)
Anesthesia Post Note  Patient: Makia Bossi  Procedure(s) Performed: LAPAROSCOPIC CHOLECYSTECTOMY     Patient location during evaluation: PACU Anesthesia Type: General Level of consciousness: awake and alert Pain management: pain level controlled Vital Signs Assessment: post-procedure vital signs reviewed and stable Respiratory status: spontaneous breathing, nonlabored ventilation, respiratory function stable and patient connected to nasal cannula oxygen Cardiovascular status: blood pressure returned to baseline and stable Postop Assessment: no apparent nausea or vomiting Anesthetic complications: no   No notable events documented.  Last Vitals:  Vitals:   03/16/22 0930 03/16/22 0943  BP: 124/66 120/69  Pulse: 73 66  Resp: 12 16  Temp: (!) 36.3 C   SpO2: 98% 98%    Last Pain:  Vitals:   03/16/22 0943  TempSrc:   PainSc: 3                  Barnet Glasgow

## 2022-03-16 NOTE — Interval H&P Note (Signed)
History and Physical Interval Note:  03/16/2022 7:32 AM  Mary Walsh  has presented today for surgery, with the diagnosis of GALLSTONES.  The various methods of treatment have been discussed with the patient and family. After consideration of risks, benefits and other options for treatment, the patient has consented to  Procedure(s): LAPAROSCOPIC CHOLECYSTECTOMY (N/A) as a surgical intervention.  The patient's history has been reviewed, patient examined, no change in status, stable for surgery.  I have reviewed the patient's chart and labs.  Questions were answered to the patient's satisfaction.     Dwan Bolt

## 2022-03-17 ENCOUNTER — Encounter (HOSPITAL_COMMUNITY): Payer: Self-pay | Admitting: Surgery

## 2022-03-17 LAB — SURGICAL PATHOLOGY

## 2022-04-27 DIAGNOSIS — Z1211 Encounter for screening for malignant neoplasm of colon: Secondary | ICD-10-CM | POA: Diagnosis not present

## 2022-05-06 DIAGNOSIS — M4721 Other spondylosis with radiculopathy, occipito-atlanto-axial region: Secondary | ICD-10-CM | POA: Diagnosis not present

## 2022-05-06 DIAGNOSIS — M9900 Segmental and somatic dysfunction of head region: Secondary | ICD-10-CM | POA: Diagnosis not present

## 2022-05-06 DIAGNOSIS — M9901 Segmental and somatic dysfunction of cervical region: Secondary | ICD-10-CM | POA: Diagnosis not present

## 2022-05-06 DIAGNOSIS — M26621 Arthralgia of right temporomandibular joint: Secondary | ICD-10-CM | POA: Diagnosis not present

## 2022-05-11 DIAGNOSIS — M9900 Segmental and somatic dysfunction of head region: Secondary | ICD-10-CM | POA: Diagnosis not present

## 2022-05-11 DIAGNOSIS — M9901 Segmental and somatic dysfunction of cervical region: Secondary | ICD-10-CM | POA: Diagnosis not present

## 2022-05-11 DIAGNOSIS — M4721 Other spondylosis with radiculopathy, occipito-atlanto-axial region: Secondary | ICD-10-CM | POA: Diagnosis not present

## 2022-05-11 DIAGNOSIS — M26621 Arthralgia of right temporomandibular joint: Secondary | ICD-10-CM | POA: Diagnosis not present

## 2022-05-17 DIAGNOSIS — M9900 Segmental and somatic dysfunction of head region: Secondary | ICD-10-CM | POA: Diagnosis not present

## 2022-05-17 DIAGNOSIS — M26621 Arthralgia of right temporomandibular joint: Secondary | ICD-10-CM | POA: Diagnosis not present

## 2022-05-17 DIAGNOSIS — M4721 Other spondylosis with radiculopathy, occipito-atlanto-axial region: Secondary | ICD-10-CM | POA: Diagnosis not present

## 2022-05-17 DIAGNOSIS — M9901 Segmental and somatic dysfunction of cervical region: Secondary | ICD-10-CM | POA: Diagnosis not present

## 2022-05-18 DIAGNOSIS — M9901 Segmental and somatic dysfunction of cervical region: Secondary | ICD-10-CM | POA: Diagnosis not present

## 2022-05-18 DIAGNOSIS — M4721 Other spondylosis with radiculopathy, occipito-atlanto-axial region: Secondary | ICD-10-CM | POA: Diagnosis not present

## 2022-05-18 DIAGNOSIS — M9900 Segmental and somatic dysfunction of head region: Secondary | ICD-10-CM | POA: Diagnosis not present

## 2022-05-18 DIAGNOSIS — M26621 Arthralgia of right temporomandibular joint: Secondary | ICD-10-CM | POA: Diagnosis not present

## 2022-05-19 DIAGNOSIS — M26621 Arthralgia of right temporomandibular joint: Secondary | ICD-10-CM | POA: Diagnosis not present

## 2022-05-19 DIAGNOSIS — M9900 Segmental and somatic dysfunction of head region: Secondary | ICD-10-CM | POA: Diagnosis not present

## 2022-05-19 DIAGNOSIS — M4721 Other spondylosis with radiculopathy, occipito-atlanto-axial region: Secondary | ICD-10-CM | POA: Diagnosis not present

## 2022-05-19 DIAGNOSIS — M9901 Segmental and somatic dysfunction of cervical region: Secondary | ICD-10-CM | POA: Diagnosis not present

## 2022-05-20 DIAGNOSIS — M9901 Segmental and somatic dysfunction of cervical region: Secondary | ICD-10-CM | POA: Diagnosis not present

## 2022-05-20 DIAGNOSIS — M9900 Segmental and somatic dysfunction of head region: Secondary | ICD-10-CM | POA: Diagnosis not present

## 2022-05-20 DIAGNOSIS — M26621 Arthralgia of right temporomandibular joint: Secondary | ICD-10-CM | POA: Diagnosis not present

## 2022-05-20 DIAGNOSIS — M4721 Other spondylosis with radiculopathy, occipito-atlanto-axial region: Secondary | ICD-10-CM | POA: Diagnosis not present

## 2022-05-24 DIAGNOSIS — M9901 Segmental and somatic dysfunction of cervical region: Secondary | ICD-10-CM | POA: Diagnosis not present

## 2022-05-24 DIAGNOSIS — M9900 Segmental and somatic dysfunction of head region: Secondary | ICD-10-CM | POA: Diagnosis not present

## 2022-05-24 DIAGNOSIS — M4721 Other spondylosis with radiculopathy, occipito-atlanto-axial region: Secondary | ICD-10-CM | POA: Diagnosis not present

## 2022-05-24 DIAGNOSIS — M26621 Arthralgia of right temporomandibular joint: Secondary | ICD-10-CM | POA: Diagnosis not present

## 2022-05-25 DIAGNOSIS — M26621 Arthralgia of right temporomandibular joint: Secondary | ICD-10-CM | POA: Diagnosis not present

## 2022-05-25 DIAGNOSIS — M9901 Segmental and somatic dysfunction of cervical region: Secondary | ICD-10-CM | POA: Diagnosis not present

## 2022-05-25 DIAGNOSIS — M9900 Segmental and somatic dysfunction of head region: Secondary | ICD-10-CM | POA: Diagnosis not present

## 2022-05-25 DIAGNOSIS — M4721 Other spondylosis with radiculopathy, occipito-atlanto-axial region: Secondary | ICD-10-CM | POA: Diagnosis not present

## 2022-05-26 DIAGNOSIS — M9901 Segmental and somatic dysfunction of cervical region: Secondary | ICD-10-CM | POA: Diagnosis not present

## 2022-05-26 DIAGNOSIS — M9900 Segmental and somatic dysfunction of head region: Secondary | ICD-10-CM | POA: Diagnosis not present

## 2022-05-26 DIAGNOSIS — M26621 Arthralgia of right temporomandibular joint: Secondary | ICD-10-CM | POA: Diagnosis not present

## 2022-05-26 DIAGNOSIS — M4721 Other spondylosis with radiculopathy, occipito-atlanto-axial region: Secondary | ICD-10-CM | POA: Diagnosis not present

## 2022-06-01 DIAGNOSIS — M26621 Arthralgia of right temporomandibular joint: Secondary | ICD-10-CM | POA: Diagnosis not present

## 2022-06-01 DIAGNOSIS — M9901 Segmental and somatic dysfunction of cervical region: Secondary | ICD-10-CM | POA: Diagnosis not present

## 2022-06-01 DIAGNOSIS — M4721 Other spondylosis with radiculopathy, occipito-atlanto-axial region: Secondary | ICD-10-CM | POA: Diagnosis not present

## 2022-06-01 DIAGNOSIS — M9900 Segmental and somatic dysfunction of head region: Secondary | ICD-10-CM | POA: Diagnosis not present

## 2022-06-03 DIAGNOSIS — M9900 Segmental and somatic dysfunction of head region: Secondary | ICD-10-CM | POA: Diagnosis not present

## 2022-06-03 DIAGNOSIS — M4721 Other spondylosis with radiculopathy, occipito-atlanto-axial region: Secondary | ICD-10-CM | POA: Diagnosis not present

## 2022-06-03 DIAGNOSIS — M9901 Segmental and somatic dysfunction of cervical region: Secondary | ICD-10-CM | POA: Diagnosis not present

## 2022-06-03 DIAGNOSIS — M26621 Arthralgia of right temporomandibular joint: Secondary | ICD-10-CM | POA: Diagnosis not present

## 2022-06-07 DIAGNOSIS — M26621 Arthralgia of right temporomandibular joint: Secondary | ICD-10-CM | POA: Diagnosis not present

## 2022-06-07 DIAGNOSIS — M9901 Segmental and somatic dysfunction of cervical region: Secondary | ICD-10-CM | POA: Diagnosis not present

## 2022-06-07 DIAGNOSIS — M4721 Other spondylosis with radiculopathy, occipito-atlanto-axial region: Secondary | ICD-10-CM | POA: Diagnosis not present

## 2022-06-07 DIAGNOSIS — M9900 Segmental and somatic dysfunction of head region: Secondary | ICD-10-CM | POA: Diagnosis not present

## 2022-06-09 ENCOUNTER — Other Ambulatory Visit: Payer: Self-pay | Admitting: Family Medicine

## 2022-06-09 DIAGNOSIS — Z1231 Encounter for screening mammogram for malignant neoplasm of breast: Secondary | ICD-10-CM

## 2022-06-09 DIAGNOSIS — M9900 Segmental and somatic dysfunction of head region: Secondary | ICD-10-CM | POA: Diagnosis not present

## 2022-06-09 DIAGNOSIS — M26621 Arthralgia of right temporomandibular joint: Secondary | ICD-10-CM | POA: Diagnosis not present

## 2022-06-09 DIAGNOSIS — M4721 Other spondylosis with radiculopathy, occipito-atlanto-axial region: Secondary | ICD-10-CM | POA: Diagnosis not present

## 2022-06-09 DIAGNOSIS — M9901 Segmental and somatic dysfunction of cervical region: Secondary | ICD-10-CM | POA: Diagnosis not present

## 2022-06-21 DIAGNOSIS — M9901 Segmental and somatic dysfunction of cervical region: Secondary | ICD-10-CM | POA: Diagnosis not present

## 2022-06-21 DIAGNOSIS — M4721 Other spondylosis with radiculopathy, occipito-atlanto-axial region: Secondary | ICD-10-CM | POA: Diagnosis not present

## 2022-06-21 DIAGNOSIS — M26621 Arthralgia of right temporomandibular joint: Secondary | ICD-10-CM | POA: Diagnosis not present

## 2022-06-21 DIAGNOSIS — M9900 Segmental and somatic dysfunction of head region: Secondary | ICD-10-CM | POA: Diagnosis not present

## 2022-06-24 DIAGNOSIS — M9900 Segmental and somatic dysfunction of head region: Secondary | ICD-10-CM | POA: Diagnosis not present

## 2022-06-24 DIAGNOSIS — M9901 Segmental and somatic dysfunction of cervical region: Secondary | ICD-10-CM | POA: Diagnosis not present

## 2022-06-24 DIAGNOSIS — M4721 Other spondylosis with radiculopathy, occipito-atlanto-axial region: Secondary | ICD-10-CM | POA: Diagnosis not present

## 2022-06-24 DIAGNOSIS — M26621 Arthralgia of right temporomandibular joint: Secondary | ICD-10-CM | POA: Diagnosis not present

## 2022-06-25 ENCOUNTER — Ambulatory Visit
Admission: RE | Admit: 2022-06-25 | Discharge: 2022-06-25 | Disposition: A | Discharge status: Home / Self Care | Payer: Medicare HMO | Source: Ambulatory Visit | Attending: Family Medicine | Admitting: Family Medicine

## 2022-06-25 DIAGNOSIS — Z1231 Encounter for screening mammogram for malignant neoplasm of breast: Secondary | ICD-10-CM

## 2022-06-28 DIAGNOSIS — M9901 Segmental and somatic dysfunction of cervical region: Secondary | ICD-10-CM | POA: Diagnosis not present

## 2022-06-28 DIAGNOSIS — M26621 Arthralgia of right temporomandibular joint: Secondary | ICD-10-CM | POA: Diagnosis not present

## 2022-06-28 DIAGNOSIS — M9900 Segmental and somatic dysfunction of head region: Secondary | ICD-10-CM | POA: Diagnosis not present

## 2022-06-28 DIAGNOSIS — M4721 Other spondylosis with radiculopathy, occipito-atlanto-axial region: Secondary | ICD-10-CM | POA: Diagnosis not present

## 2022-07-05 DIAGNOSIS — M4721 Other spondylosis with radiculopathy, occipito-atlanto-axial region: Secondary | ICD-10-CM | POA: Diagnosis not present

## 2022-07-05 DIAGNOSIS — M9900 Segmental and somatic dysfunction of head region: Secondary | ICD-10-CM | POA: Diagnosis not present

## 2022-07-05 DIAGNOSIS — M26621 Arthralgia of right temporomandibular joint: Secondary | ICD-10-CM | POA: Diagnosis not present

## 2022-07-05 DIAGNOSIS — M9901 Segmental and somatic dysfunction of cervical region: Secondary | ICD-10-CM | POA: Diagnosis not present

## 2022-07-12 DIAGNOSIS — M4721 Other spondylosis with radiculopathy, occipito-atlanto-axial region: Secondary | ICD-10-CM | POA: Diagnosis not present

## 2022-07-12 DIAGNOSIS — M9901 Segmental and somatic dysfunction of cervical region: Secondary | ICD-10-CM | POA: Diagnosis not present

## 2022-07-12 DIAGNOSIS — M9900 Segmental and somatic dysfunction of head region: Secondary | ICD-10-CM | POA: Diagnosis not present

## 2022-07-12 DIAGNOSIS — M26621 Arthralgia of right temporomandibular joint: Secondary | ICD-10-CM | POA: Diagnosis not present

## 2022-09-17 DIAGNOSIS — B029 Zoster without complications: Secondary | ICD-10-CM | POA: Diagnosis not present

## 2022-09-29 DIAGNOSIS — K219 Gastro-esophageal reflux disease without esophagitis: Secondary | ICD-10-CM | POA: Diagnosis not present

## 2022-09-29 DIAGNOSIS — G5602 Carpal tunnel syndrome, left upper limb: Secondary | ICD-10-CM | POA: Diagnosis not present

## 2022-09-29 DIAGNOSIS — R202 Paresthesia of skin: Secondary | ICD-10-CM | POA: Diagnosis not present

## 2022-09-29 DIAGNOSIS — R140 Abdominal distension (gaseous): Secondary | ICD-10-CM | POA: Diagnosis not present

## 2022-11-22 DIAGNOSIS — L218 Other seborrheic dermatitis: Secondary | ICD-10-CM | POA: Diagnosis not present

## 2022-11-22 DIAGNOSIS — Q825 Congenital non-neoplastic nevus: Secondary | ICD-10-CM | POA: Diagnosis not present

## 2022-11-22 DIAGNOSIS — L821 Other seborrheic keratosis: Secondary | ICD-10-CM | POA: Diagnosis not present

## 2022-11-22 DIAGNOSIS — L661 Lichen planopilaris: Secondary | ICD-10-CM | POA: Diagnosis not present

## 2022-11-22 DIAGNOSIS — D2239 Melanocytic nevi of other parts of face: Secondary | ICD-10-CM | POA: Diagnosis not present

## 2022-11-22 DIAGNOSIS — D1801 Hemangioma of skin and subcutaneous tissue: Secondary | ICD-10-CM | POA: Diagnosis not present

## 2022-11-22 DIAGNOSIS — L814 Other melanin hyperpigmentation: Secondary | ICD-10-CM | POA: Diagnosis not present

## 2022-11-22 DIAGNOSIS — D225 Melanocytic nevi of trunk: Secondary | ICD-10-CM | POA: Diagnosis not present

## 2022-11-24 DIAGNOSIS — K5900 Constipation, unspecified: Secondary | ICD-10-CM | POA: Diagnosis not present

## 2022-11-24 DIAGNOSIS — Z1211 Encounter for screening for malignant neoplasm of colon: Secondary | ICD-10-CM | POA: Diagnosis not present

## 2022-11-24 DIAGNOSIS — R140 Abdominal distension (gaseous): Secondary | ICD-10-CM | POA: Diagnosis not present

## 2022-11-26 DIAGNOSIS — R140 Abdominal distension (gaseous): Secondary | ICD-10-CM | POA: Diagnosis not present

## 2022-12-11 IMAGING — MG MM DIGITAL DIAGNOSTIC UNILAT*R* W/ TOMO W/ CAD
6 series · 6 of 18 positions shown · non-contrast
Comparison: Previous exam(s).

CLINICAL DATA: Possible right breast mass seen on most recent
screening mammography.

EXAM:
DIGITAL DIAGNOSTIC UNILATERAL RIGHT MAMMOGRAM WITH TOMOSYNTHESIS AND
CAD; ULTRASOUND RIGHT BREAST LIMITED
TECHNIQUE: Right digital diagnostic mammography and breast tomosynthesis was
performed. The images were evaluated with computer-aided detection.;
Targeted ultrasound examination of the right breast was performed

[R MLO synth-2D (1 of 2)]
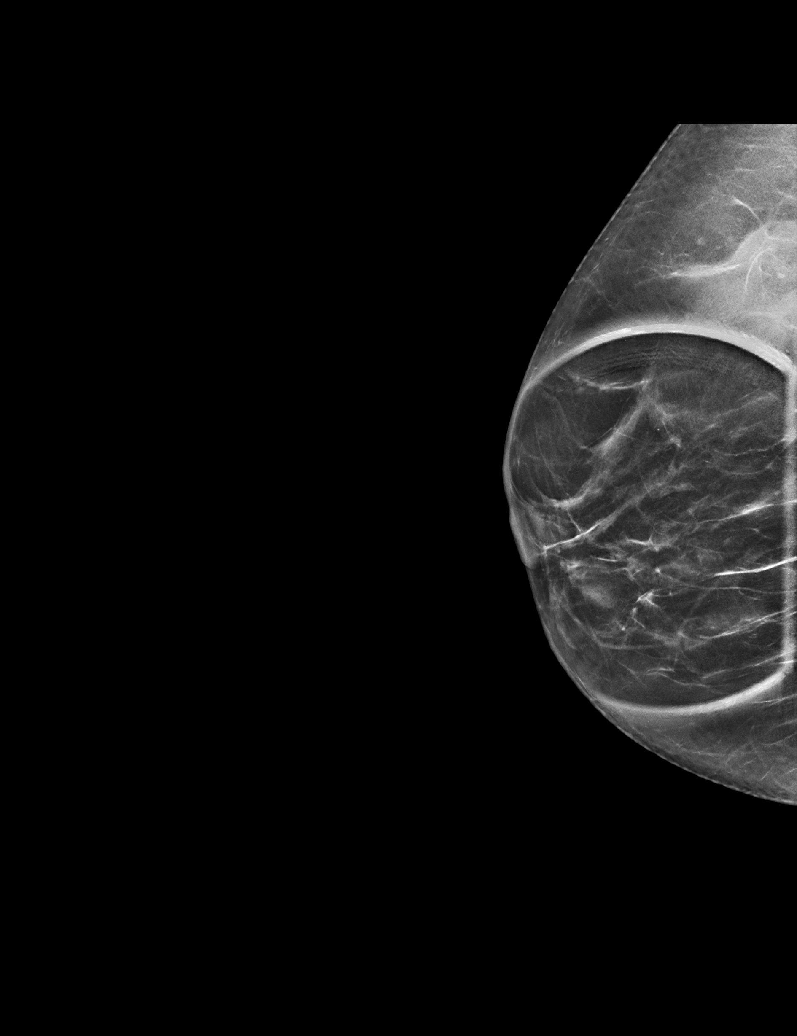

[R ML synth-2D]
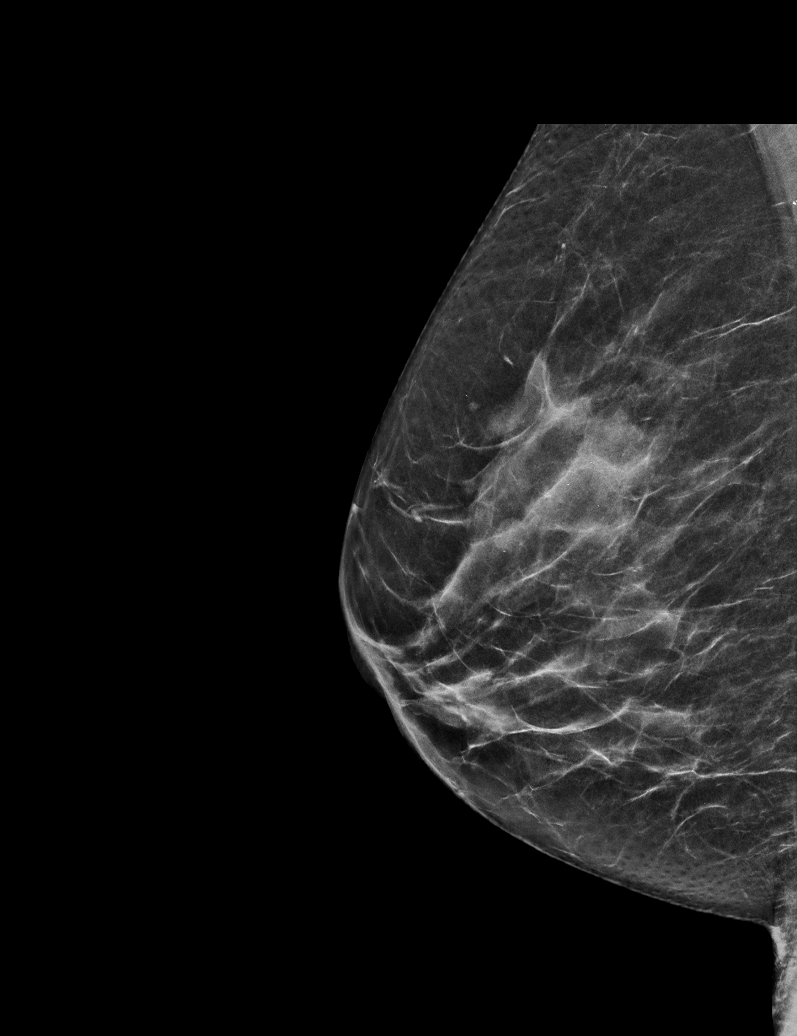

[R MLO synth-2D (2 of 2)]
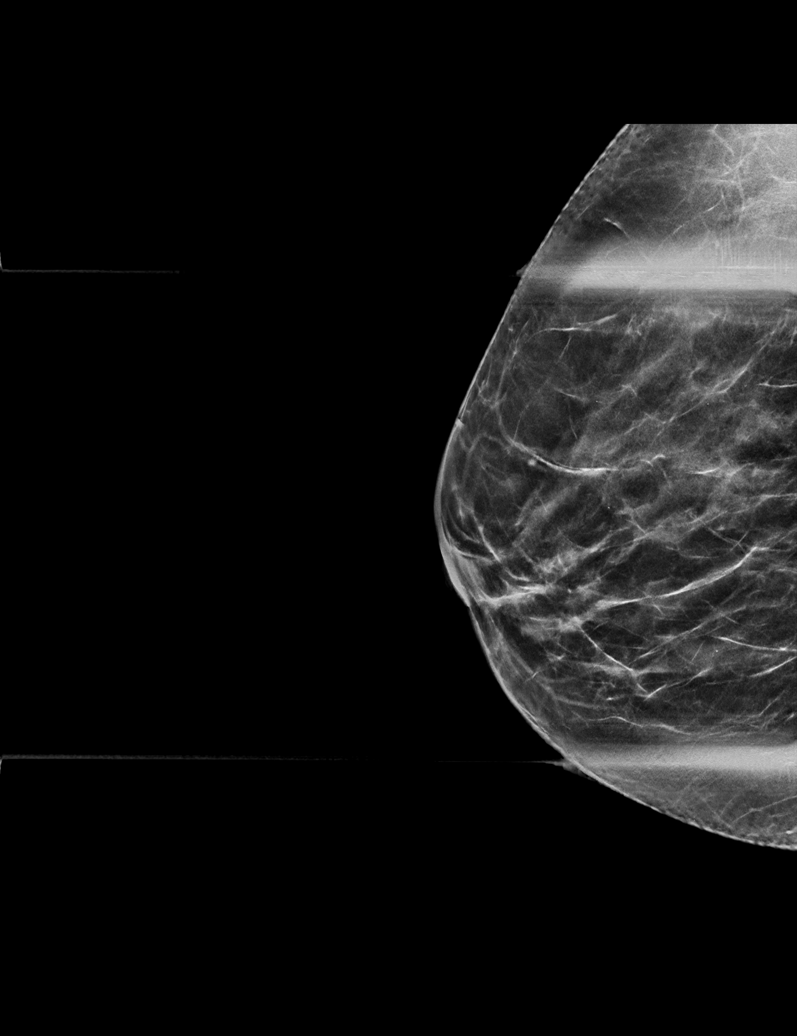

[R MLO tomo (1 of 2) · tomo slice 24/47.0]
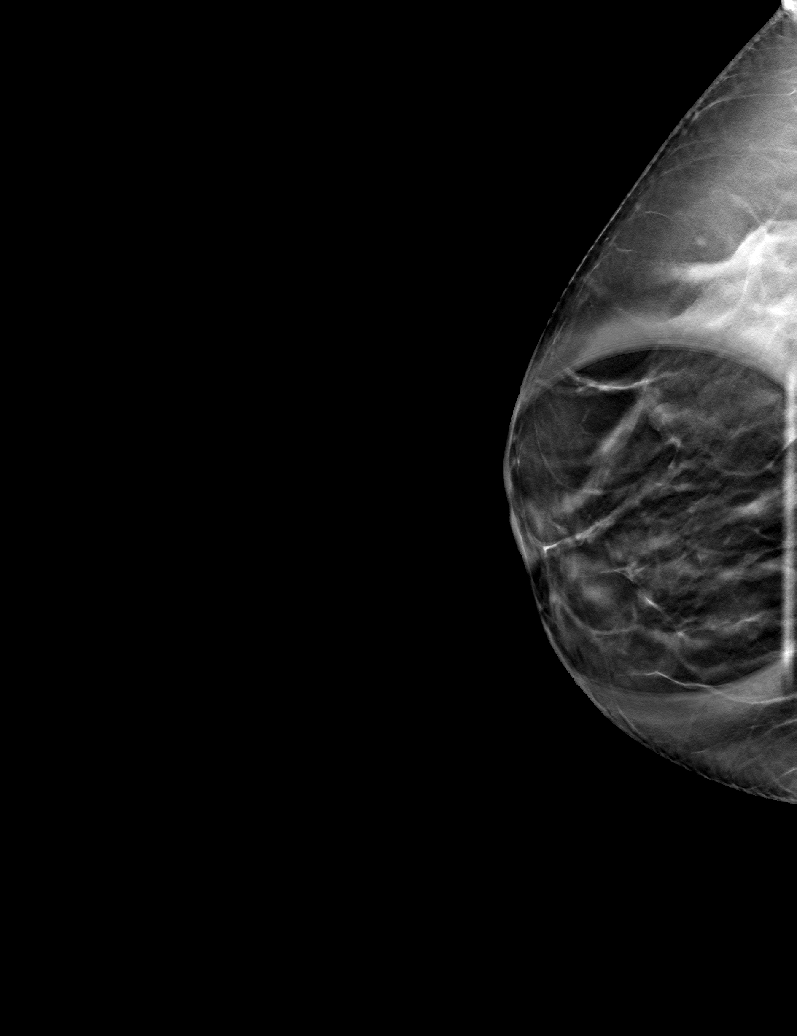

[R ML tomo · tomo slice 29/58.0]
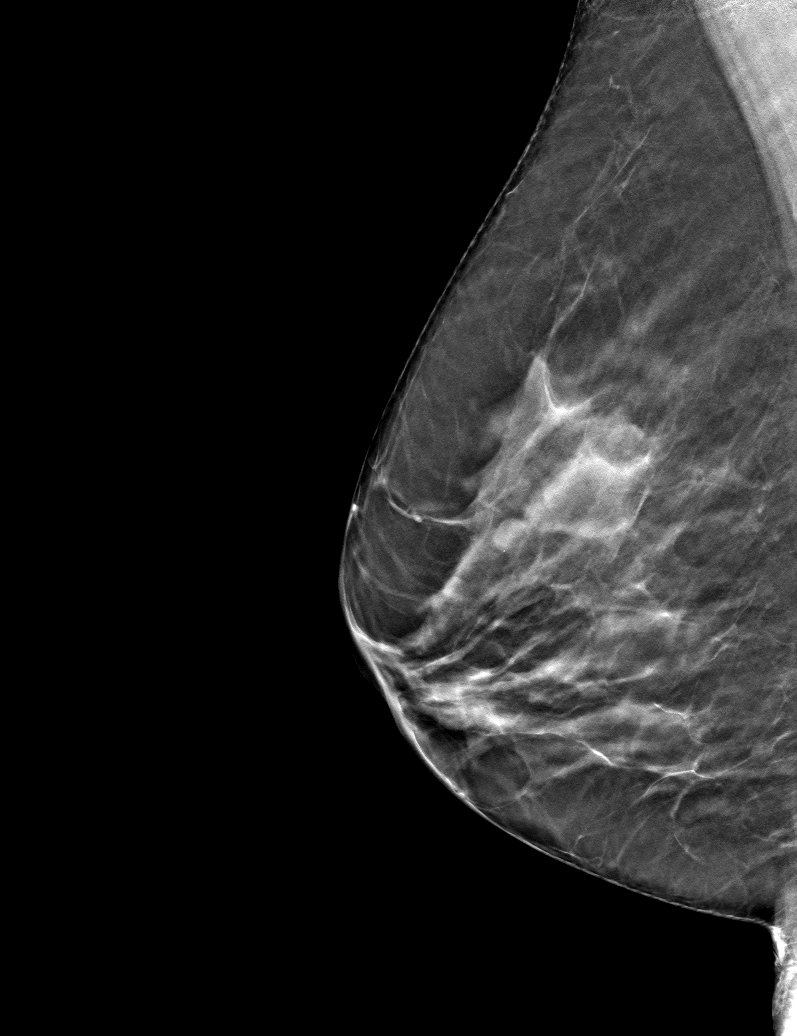

[R MLO tomo (2 of 2) · tomo slice 23/46.0]
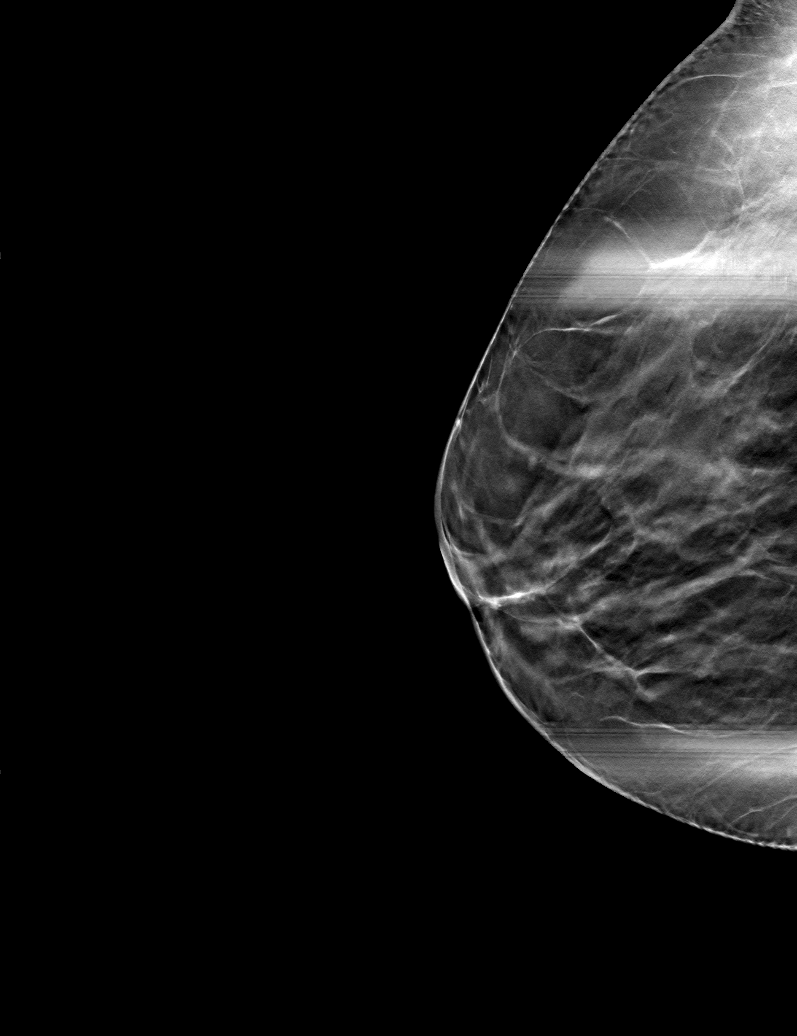

[6 of 18 positions shown; findings below may reference images not displayed]

ACR Breast Density Category c: The breast tissue is heterogeneously
dense, which may obscure small masses.
FINDINGS: Additional mammographic views of the right breast demonstrate
persistent circumscribed low-density mass in the right breast upper
inner quadrant, anterior depth, with a single wall calcification,
most consistent with a breast cyst.

Targeted right breast ultrasound is performed demonstrating 1
o'clock 1 cm from the nipple benign-appearing cyst measuring 0.7 x
0.4 x 0.7 cm. This finding corresponds to the mammographically seen
mass. In the adjacent breast parenchyma there is a second
benign-appearing 5 mm cyst.
IMPRESSION: No mammographic or sonographic evidence of right breast malignancy.

Two benign-appearing right breast cysts.

RECOMMENDATION:
Screening mammogram in one year.(Code:[id number]-[id number]-37Z)

I have discussed the findings and recommendations with the patient.
If applicable, a reminder letter will be sent to the patient
regarding the next appointment.

BI-RADS CATEGORY  2: Benign.

## 2023-01-04 DIAGNOSIS — Z008 Encounter for other general examination: Secondary | ICD-10-CM | POA: Diagnosis not present

## 2023-01-04 DIAGNOSIS — R69 Illness, unspecified: Secondary | ICD-10-CM | POA: Diagnosis not present

## 2023-01-04 DIAGNOSIS — Z823 Family history of stroke: Secondary | ICD-10-CM | POA: Diagnosis not present

## 2023-01-04 DIAGNOSIS — Z885 Allergy status to narcotic agent status: Secondary | ICD-10-CM | POA: Diagnosis not present

## 2023-01-04 DIAGNOSIS — Z79890 Hormone replacement therapy (postmenopausal): Secondary | ICD-10-CM | POA: Diagnosis not present

## 2023-01-04 DIAGNOSIS — Z8249 Family history of ischemic heart disease and other diseases of the circulatory system: Secondary | ICD-10-CM | POA: Diagnosis not present

## 2023-01-04 DIAGNOSIS — N951 Menopausal and female climacteric states: Secondary | ICD-10-CM | POA: Diagnosis not present

## 2023-01-04 DIAGNOSIS — Z833 Family history of diabetes mellitus: Secondary | ICD-10-CM | POA: Diagnosis not present

## 2023-01-05 DIAGNOSIS — Z1211 Encounter for screening for malignant neoplasm of colon: Secondary | ICD-10-CM | POA: Diagnosis not present

## 2023-01-05 DIAGNOSIS — R140 Abdominal distension (gaseous): Secondary | ICD-10-CM | POA: Diagnosis not present

## 2023-01-31 ENCOUNTER — Other Ambulatory Visit (HOSPITAL_BASED_OUTPATIENT_CLINIC_OR_DEPARTMENT_OTHER): Payer: Self-pay | Admitting: Family Medicine

## 2023-01-31 DIAGNOSIS — E559 Vitamin D deficiency, unspecified: Secondary | ICD-10-CM | POA: Diagnosis not present

## 2023-01-31 DIAGNOSIS — E785 Hyperlipidemia, unspecified: Secondary | ICD-10-CM | POA: Diagnosis not present

## 2023-01-31 DIAGNOSIS — R7303 Prediabetes: Secondary | ICD-10-CM | POA: Diagnosis not present

## 2023-01-31 DIAGNOSIS — M8588 Other specified disorders of bone density and structure, other site: Secondary | ICD-10-CM | POA: Diagnosis not present

## 2023-01-31 DIAGNOSIS — I700 Atherosclerosis of aorta: Secondary | ICD-10-CM | POA: Diagnosis not present

## 2023-01-31 DIAGNOSIS — Z0000 Encounter for general adult medical examination without abnormal findings: Secondary | ICD-10-CM | POA: Diagnosis not present

## 2023-02-01 ENCOUNTER — Other Ambulatory Visit: Payer: Self-pay | Admitting: Family Medicine

## 2023-02-01 DIAGNOSIS — Z1231 Encounter for screening mammogram for malignant neoplasm of breast: Secondary | ICD-10-CM

## 2023-02-09 ENCOUNTER — Ambulatory Visit (HOSPITAL_BASED_OUTPATIENT_CLINIC_OR_DEPARTMENT_OTHER)
Admission: RE | Admit: 2023-02-09 | Discharge: 2023-02-09 | Disposition: A | Discharge status: Home / Self Care | Payer: Medicare HMO | Source: Ambulatory Visit | Attending: Family Medicine | Admitting: Family Medicine

## 2023-02-09 DIAGNOSIS — I700 Atherosclerosis of aorta: Secondary | ICD-10-CM | POA: Insufficient documentation

## 2023-03-09 ENCOUNTER — Ambulatory Visit: Payer: Medicare HMO | Attending: Cardiovascular Disease | Admitting: Cardiovascular Disease

## 2023-03-09 ENCOUNTER — Encounter: Payer: Self-pay | Admitting: Cardiovascular Disease

## 2023-03-09 VITALS — BP 116/82 | HR 74 | Ht 66.5 in | Wt 157.4 lb

## 2023-03-09 DIAGNOSIS — E782 Mixed hyperlipidemia: Secondary | ICD-10-CM | POA: Diagnosis not present

## 2023-03-09 DIAGNOSIS — R002 Palpitations: Secondary | ICD-10-CM | POA: Diagnosis not present

## 2023-03-09 DIAGNOSIS — E785 Hyperlipidemia, unspecified: Secondary | ICD-10-CM | POA: Insufficient documentation

## 2023-03-09 MED ORDER — ROSUVASTATIN CALCIUM 5 MG PO TABS: 5.0000 mg | ORAL_TABLET | ORAL | 3 refills | Status: DC

## 2023-03-09 NOTE — Progress Notes (Signed)
03/09/2023 Mary Walsh   02/06/1951  [id number]  Primary Physician Laurann [place], MD Primary Cardiologist: Runell Gess MD Nicholes [place], [place]  HPI:  Mary Walsh is a 72 y.o.  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 She was referred to me by her PCP, Dr. Cliffton Asters, for evaluation of symptomatic palpitations.  I last saw her in the office 06/24/2020.  She basically has no cardiac risk factors other than a mother who has had stents in a father who has had a A. fib and ablation. She is never had a heart attack or stroke. She denies chest pain or shortness of breath. She does drink caffeine in the morning. She works out 3 to 4 days a week without limitation. She developed palpitations in 2019 which at that time were positional when she was on her left side. These have become more frequent. They have awakened her from sleep.   Since I saw her 3 years ago she did have a cholecystectomy 03/16/2022 and as result her palpitations have resolved.  She is fairly active.  She denies chest pain or shortness of breath.  She did have a coronary calcium score performed 02/09/2023 which was 1.2.  Her most recent lipid profile performed 01/31/2023 revealed total cholesterol of 209, LDL 128 and HDL of 63.   No outpatient medications have been marked as taking for the 03/09/23 encounter (Office Visit) with Runell Gess, MD.     Allergies  Allergen Reactions   Chocolate     Migraines    Latex Itching    Per pt interview prior to surgery   Other Other (See Comments)    "Nitrates and other preservatives case severe headaches "    Sulfites Diarrhea    Severe N/V/D    Codeine     Out of body experience     Social History   Socioeconomic History   Marital status: Married    Spouse name: Not on file   Number of children: Not on file   Years of  education: Not on file   Highest education level: Not on file  Occupational History   Not on file  Tobacco Use   Smoking status: Never   Smokeless tobacco: Never  Vaping Use   Vaping Use: Never used  Substance and Sexual Activity   Alcohol use: No   Drug use: No   Sexual activity: Not Currently  Other Topics Concern   Not on file  Social History Narrative   Not on file   Social Determinants of Health   Financial Resource Strain: Not on file  Food Insecurity: Not on file  Transportation Needs: Not on file  Physical Activity: Not on file  Stress: Not on file  Social Connections: Not on file  Intimate Partner Violence: Not on file     Review of Systems: General: negative for chills, fever, night sweats or weight changes.  Cardiovascular: negative for chest pain, dyspnea on exertion, edema, orthopnea, palpitations, paroxysmal nocturnal dyspnea or shortness of breath Dermatological: negative for rash Respiratory: negative for cough or wheezing Urologic: negative for hematuria Abdominal: negative for nausea, vomiting, diarrhea, bright red blood per rectum, melena, or hematemesis Neurologic: negative for visual changes, syncope, or dizziness All other systems reviewed and are otherwise negative except as noted above.    Blood pressure 116/82, pulse 74, height 5' 6.5" (1.689  m), weight 157 lb 6.4 oz (71.4 kg), SpO2 98 %.  General appearance: alert and no distress Neck: no adenopathy, no carotid bruit, no JVD, supple, symmetrical, trachea midline, and thyroid not enlarged, symmetric, no tenderness/mass/nodules Lungs: clear to auscultation bilaterally Heart: regular rate and rhythm, S1, S2 normal, no murmur, click, rub or gallop Extremities: extremities normal, atraumatic, no cyanosis or edema Pulses: 2+ and symmetric Skin: Skin color, texture, turgor normal. No rashes or lesions Neurologic: Grossly normal  EKG sinus rhythm at 74 with right bundle branch block which is an old  finding.  I personally reviewed this EKG.  ASSESSMENT AND PLAN:   Palpitations Patient was originally sent to me because of palpitations.  An event monitor showed no evidence of any arrhythmias.  She subsequently had a cholecystectomy 03/16/2022 and her palpitations have since resolved.  Hyperlipidemia History of mild hyperlipidemia lipid profile performed 01/31/2023 revealing total cholesterol 209, LDL 128 and HDL 63.  She is currently not on statin therapy.  Her coronary calcium score performed 02/13/2023 was 1.2.  I am going to start her on rosuvastatin 5 mg 3 times a week for primary prevention with an LDL goal less than 100.     Runell Gess MD FACP,FACC,FAHA, [facility] 03/09/2023 1:38 PM

## 2023-03-09 NOTE — Patient Instructions (Signed)
Medication Instructions:  Your physician has recommended you make the following change in your medication:   -Start rosuvastatin (crestor) 5mg  three times a week.  *If you need a refill on your cardiac medications before your next appointment, please call your pharmacy*   Lab Work: Your physician recommends that you return for lab work in: 3 months for FASTING lipid/liver panel  If you have labs (blood work) drawn today and your tests are completely normal, you will receive your results only by: MyChart Message (if you have MyChart) OR A paper copy in the mail If you have any lab test that is abnormal or we need to change your treatment, we will call you to review the results.    Follow-Up: At [facility], you and your health needs are our priority.  As part of our continuing mission to provide you with exceptional heart care, we have created designated Provider Care Teams.  These Care Teams include your primary Cardiologist (physician) and Advanced Practice Providers (APPs -  Physician Assistants and Nurse Practitioners) who all work together to provide you with the care you need, when you need it.  We recommend signing up for the patient portal called "MyChart".  Sign up information is provided on this After Visit Summary.  MyChart is used to connect with patients for Virtual Visits (Telemedicine).  Patients are able to view lab/test results, encounter notes, upcoming appointments, etc.  Non-urgent messages can be sent to your provider as well.   To learn more about what you can do with MyChart, go to [email or web address].    Your next appointment:   6 month(s)  Provider:   Nanetta Batty, MD

## 2023-03-09 NOTE — Assessment & Plan Note (Signed)
History of mild hyperlipidemia lipid profile performed 01/31/2023 revealing total cholesterol 209, LDL 128 and HDL 63.  She is currently not on statin therapy.  Her coronary calcium score performed 02/13/2023 was 1.2.  I am going to start her on rosuvastatin 5 mg 3 times a week for primary prevention with an LDL goal less than 100.

## 2023-03-09 NOTE — Assessment & Plan Note (Signed)
Patient was originally sent to me because of palpitations.  An event monitor showed no evidence of any arrhythmias.  She subsequently had a cholecystectomy 03/16/2022 and her palpitations have since resolved.

## 2023-05-12 ENCOUNTER — Other Ambulatory Visit: Payer: Self-pay | Admitting: Family Medicine

## 2023-05-12 DIAGNOSIS — Z1231 Encounter for screening mammogram for malignant neoplasm of breast: Secondary | ICD-10-CM

## 2023-05-13 ENCOUNTER — Encounter: Payer: Self-pay | Admitting: Cardiovascular Disease

## 2023-05-31 DIAGNOSIS — H5203 Hypermetropia, bilateral: Secondary | ICD-10-CM | POA: Diagnosis not present

## 2023-05-31 DIAGNOSIS — H2513 Age-related nuclear cataract, bilateral: Secondary | ICD-10-CM | POA: Diagnosis not present

## 2023-06-21 DIAGNOSIS — E782 Mixed hyperlipidemia: Secondary | ICD-10-CM | POA: Diagnosis not present

## 2023-06-21 DIAGNOSIS — R002 Palpitations: Secondary | ICD-10-CM | POA: Diagnosis not present

## 2023-06-22 LAB — HEPATIC FUNCTION PANEL
ALT: 11 IU/L (ref 0–32)
AST: 20 IU/L (ref 0–40)
Albumin: 4.1 g/dL (ref 3.9–4.9)
Alkaline Phosphatase: 85 IU/L (ref 44–121)
Bilirubin Total: 0.4 mg/dL (ref 0.0–1.2)
Bilirubin, Direct: 0.13 mg/dL (ref 0.00–0.40)
Total Protein: 6.4 g/dL (ref 6.0–8.5)

## 2023-06-22 LAB — LIPID PANEL
Chol/HDL Ratio: 4.2 ratio (ref 0.0–4.4)
Cholesterol, Total: 201 mg/dL — ABNORMAL HIGH (ref 100–199)
HDL: 48 mg/dL (ref >39)
LDL Chol Calc (NIH): 131 mg/dL — ABNORMAL HIGH (ref 0–99)
Triglycerides: 123 mg/dL (ref 0–149)
VLDL Cholesterol Cal: 22 mg/dL (ref 5–40)

## 2023-06-28 ENCOUNTER — Encounter (HOSPITAL_BASED_OUTPATIENT_CLINIC_OR_DEPARTMENT_OTHER): Payer: Self-pay | Admitting: Internal Medicine

## 2023-06-28 ENCOUNTER — Ambulatory Visit (HOSPITAL_BASED_OUTPATIENT_CLINIC_OR_DEPARTMENT_OTHER): Payer: Medicare HMO | Admitting: Internal Medicine

## 2023-06-28 VITALS — BP 110/66 | HR 66 | Ht 67.0 in | Wt 156.8 lb

## 2023-06-28 DIAGNOSIS — I2510 Atherosclerotic heart disease of native coronary artery without angina pectoris: Secondary | ICD-10-CM

## 2023-06-28 DIAGNOSIS — E785 Hyperlipidemia, unspecified: Secondary | ICD-10-CM

## 2023-06-28 NOTE — Progress Notes (Signed)
LIPID CLINIC CONSULT NOTE  Chief Complaint:  Manage dyslipidemia  Primary Care Physician: Mary [place], MD  Primary Cardiologist:  Mary Batty, MD  HPI:  Mary Walsh is a 72 y.o. female who is being seen today for the evaluation of dyslipidemia at the request of Dr. Allyson Walsh.  This is a pleasant 72 year old female kindly referred for evaluation and management of dyslipidemia.  She has a family history of stroke in her mother and her maternal grandfather.  She has no personal history of any heart disease.  Recently she had a coronary calcium score performed by her primary care provider which was minimally abnormal showing coronary calcium score 1.2 in the LAD which was 43rd percentile.  She was recommended to start on statin therapy, low-dose rosuvastatin 5 mg 3 times a week.  She took an initial 5 mg rosuvastatin tablet and felt completely wiped out and "poisoned".  She said she felt " like the life was leaving her body".  She says historically she has not done well with medications and prefers a natural alternative to therapy.  She is ready made significant changes to her diet although historically has eaten a [place] style diet.  She has had previous weight loss and had made significant dietary changes.  PMHx:  Past Medical History:  Diagnosis Date   GERD (gastroesophageal reflux disease)    Headache    years ago   Pneumonia    Right lower lung    Past Surgical History:  Procedure Laterality Date   CHOLECYSTECTOMY N/A 03/16/2022   Procedure: LAPAROSCOPIC CHOLECYSTECTOMY;  Surgeon: Mary Mandes, MD;  Location: [facility];  Service: General;  Laterality: N/A;   COLONOSCOPY      FAMHx:  Family History  Problem Relation Age of Onset   Breast cancer Neg Hx     SOCHx:   reports that she has never smoked. She has never used smokeless tobacco. She reports that she does not drink alcohol and does not use drugs.  ALLERGIES:  Allergies  Allergen Reactions    Chocolate     Migraines    Latex Itching    Per pt interview prior to surgery   Other Other (See Comments)    "Nitrates and other preservatives case severe headaches "    Rosuvastatin Other (See Comments)    5mg  - 3x/week - tired, achy   Sulfites Diarrhea    Severe N/V/D    Codeine     Out of body experience     ROS: Pertinent items noted in HPI and remainder of comprehensive ROS otherwise negative.  HOME MEDS: Current Outpatient Medications on File Prior to Visit  Medication Sig Dispense Refill   Ascorbic Acid (VITAMIN C PO) Take 950 mg by mouth daily.     Cholecalciferol (DIALYVITE VITAMIN D 5000) 125 MCG (5000 UT) capsule Take 5,000 Units by mouth daily.     Misc Natural Products (LUTEIN VISION BLEND PO) Take 1 capsule by mouth 2 (two) times daily.     Multiple Vitamin (MULTIVITAMIN WITH MINERALS) TABS tablet Take 2 tablets by mouth daily.     OVER THE COUNTER MEDICATION Take 1 capsule by mouth daily. Methylation complete otc supplement     PRESCRIPTION MEDICATION Estradiol 2mg /Ml Cr - apply 2 clicks to inner thighs twice daily     progesterone (PROMETRIUM) 100 MG capsule Take 200 mg by mouth at bedtime.     No current facility-administered medications on file prior to visit.    LABS/IMAGING: No  results found for this or any previous visit (from the past 48 hour(s)). No results found.  LIPID PANEL:    Component Value Date/Time   CHOL 201 (H) 06/21/2023 0939   TRIG 123 06/21/2023 0939   HDL 48 06/21/2023 0939   CHOLHDL 4.2 06/21/2023 0939   LDLCALC 131 (H) 06/21/2023 0939    WEIGHTS: Wt Readings from Last 3 Encounters:  06/28/23 156 lb 12.8 oz (71.1 kg)  03/09/23 157 lb 6.4 oz (71.4 kg)  03/16/22 147 lb 0.8 oz (66.7 kg)    VITALS: BP 110/66 (BP Location: Left Arm, Patient Position: Sitting, Cuff Size: Normal)   Pulse 66   Ht 5\' 7"  (1.702 m)   Wt 156 lb 12.8 oz (71.1 kg)   SpO2 98%   BMI 24.56 kg/m   EXAM: Deferred  EKG: Deferred  ASSESSMENT: Mixed  dyslipidemia, goal LDL less than 100 CAC score 1.2, 43rd percentile (01/2023) Family history of stroke in mother and maternal grandfather Statin intolerance   PLAN: 1.   [name] has a mixed hyperlipidemia with goal LDL less than 100.  She trialed low-dose statin therapy but had significant side effects with it.  She is interested in natural therapies.  We discussed several alternative therapies today including soluble fiber, red yeast rice, plant fiber sterols and citrus bergamot, all of which show some some benefits with respect to LDL lowering.  She was advised that red yeast rice also could have statin side effects that she should monitor for.  She wishes to try these alternatives and plan repeat cholesterol testing in about 3 to 4 months.  Will check an LP(a) as well given family history of stroke.  Follow-up at that time.  Thanks again for the kind referral.  Mary Nose, MD, [facility]  [place]  Mary Walsh  Medical Director of the [facility]  Card[facility]ovascular [facility] Reduction [facility] Diplomate of the American Board of Clinical Lipidology Attending Cardiologist  Direct Dial: [phone]  Fax: [phone]  Website:  [email or web address] 06/28/2023, 9:37 AM

## 2023-06-28 NOTE — Patient Instructions (Addendum)
Medication Instructions:  NATURAL ALTERNATIVES TO TRY ALONG WITH DIET  RED YEAST RICE CITRUS BERGAMOT  CHOLEST-OFF METAMUCIL (OR SOLUBLE FIBER)  *If you need a refill on your cardiac medications before your next appointment, please call your pharmacy*  Lab Work: NMR/LPa IN 3-4  MONTHS ABOUT A WEEK PRIOR TO FOLLOW UP   If you have labs (blood work) drawn today and your tests are completely normal, you will receive your results only by: MyChart Message (if you have MyChart) OR A paper copy in the mail If you have any lab test that is abnormal or we need to change your treatment, we will call you to review the results.  Testing/Procedures: NONE  Follow-Up: At [facility], you and your health needs are our priority.  As part of our continuing mission to provide you with exceptional heart care, we have created designated Provider Care Teams.  These Care Teams include your primary Cardiologist (physician) and Advanced Practice Providers (APPs -  Physician Assistants and Nurse Practitioners) who all work together to provide you with the care you need, when you need it.  We recommend signing up for the patient portal called "MyChart".  Sign up information is provided on this After Visit Summary.  MyChart is used to connect with patients for Virtual Visits (Telemedicine).  Patients are able to view lab/test results, encounter notes, upcoming appointments, etc.  Non-urgent messages can be sent to your provider as well.   To learn more about what you can do with MyChart, go to [email or web address].    Your next appointment:   ABOUT 1 WEEK AFTER LABS IN 3-4  month(s)  Provider:   K. [place] Hilty, MD or Eligha Bridegroom, NP    Other Instructions SEE HEART-HEALTHY EATING [facility] STYLE ATTACHED

## 2023-08-22 ENCOUNTER — Ambulatory Visit
Admission: RE | Admit: 2023-08-22 | Discharge: 2023-08-22 | Disposition: A | Discharge status: Home / Self Care | Payer: Medicare HMO | Source: Ambulatory Visit | Attending: Family Medicine | Admitting: Family Medicine

## 2023-08-22 DIAGNOSIS — M8588 Other specified disorders of bone density and structure, other site: Secondary | ICD-10-CM | POA: Diagnosis not present

## 2023-08-22 DIAGNOSIS — Z1231 Encounter for screening mammogram for malignant neoplasm of breast: Secondary | ICD-10-CM

## 2023-08-22 DIAGNOSIS — N958 Other specified menopausal and perimenopausal disorders: Secondary | ICD-10-CM | POA: Diagnosis not present

## 2023-08-22 DIAGNOSIS — E2839 Other primary ovarian failure: Secondary | ICD-10-CM | POA: Diagnosis not present

## 2023-08-24 IMAGING — US US ABDOMEN LIMITED
1 series · 14 of 25 positions shown · non-contrast
Comparison: Same day CT

CLINICAL DATA: [id number]; RIGHT upper quadrant pain

EXAM:
ULTRASOUND ABDOMEN LIMITED RIGHT UPPER QUADRANT

[Series 1: us abdomen limited · 14 of 69 slices shown]
[im 1/69]
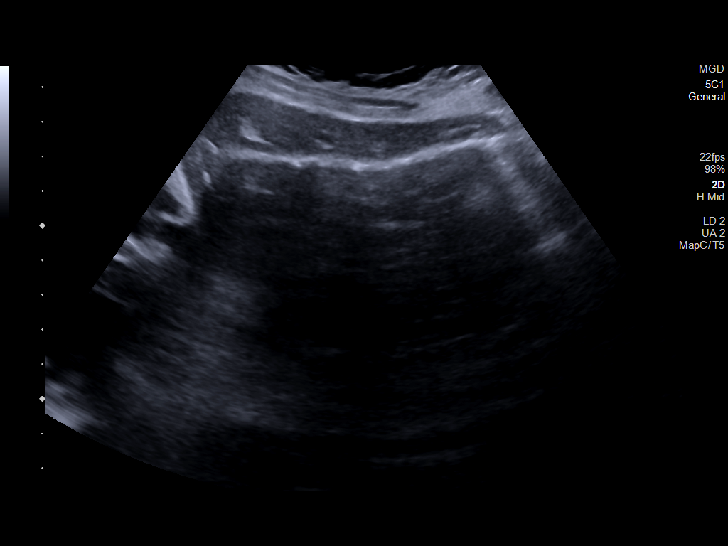
[im 6/69]
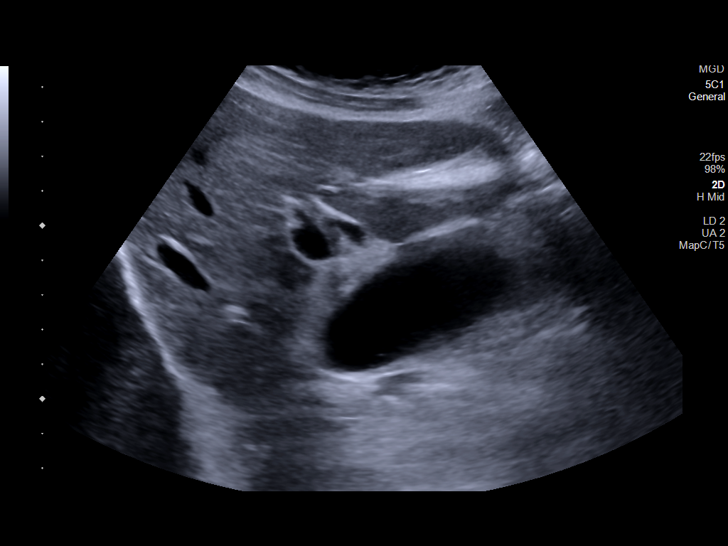
[im 12/69]
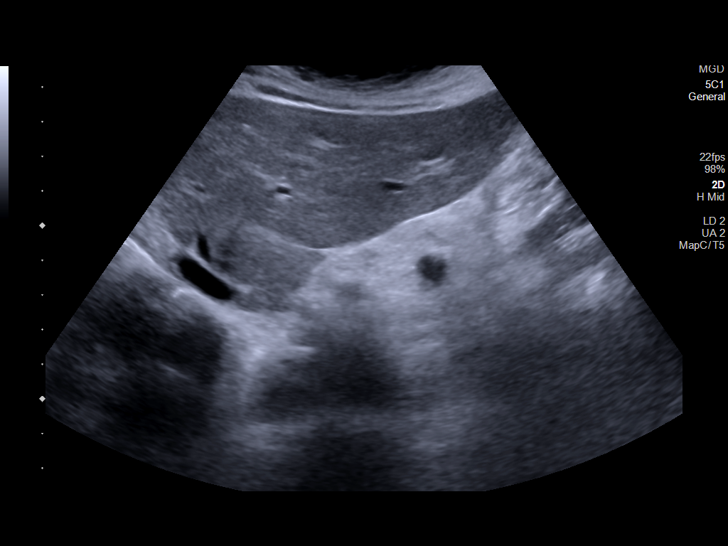
[im 18/69]
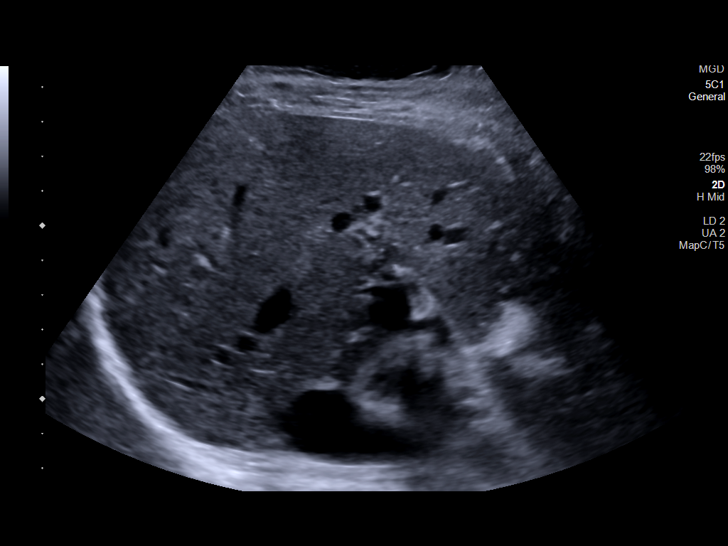
[im 23/69]
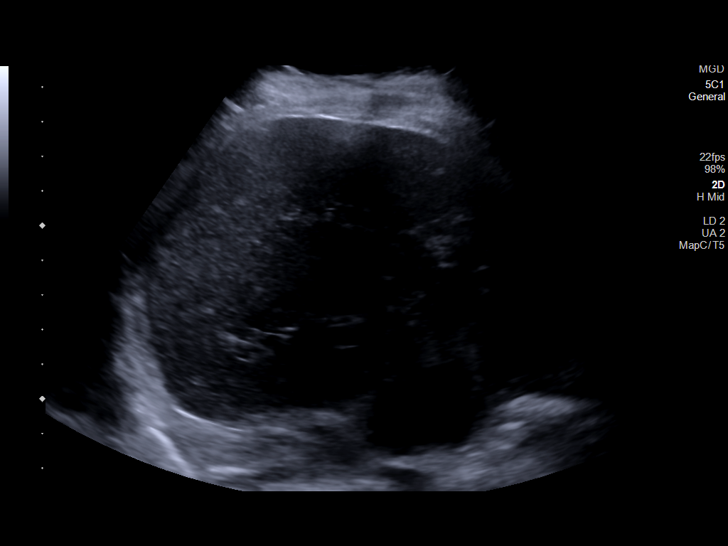
[im 26/69]
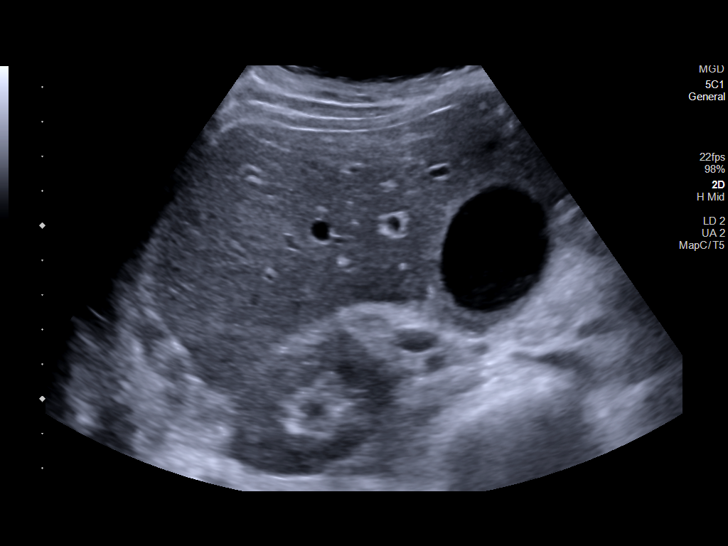
[im 32/69]
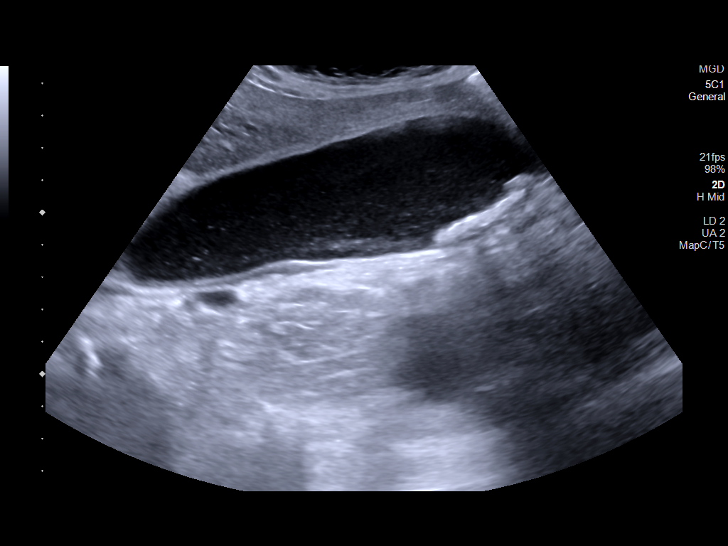
[im 37/69]
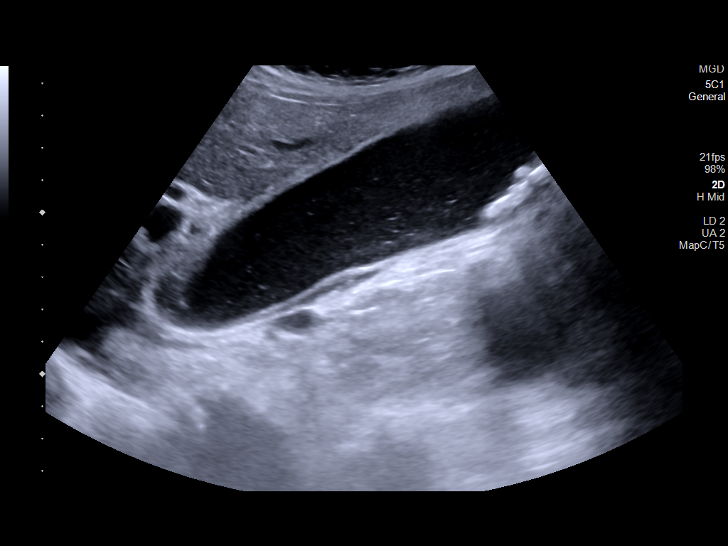
[im 43/69]
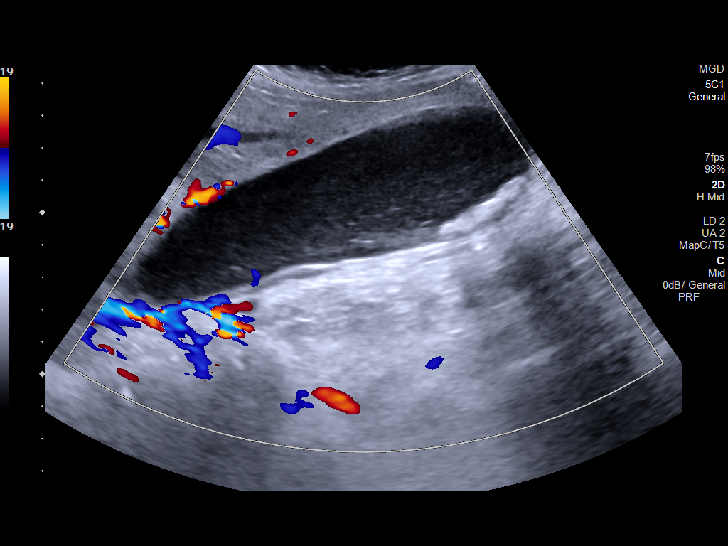
[im 46/69]
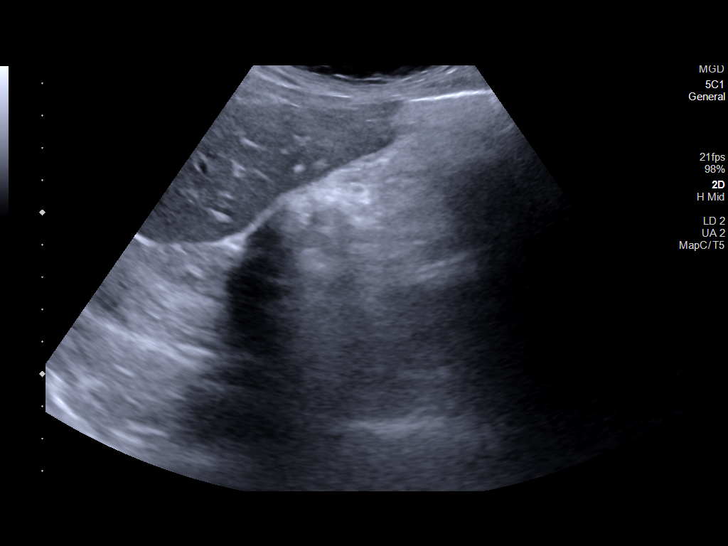
[im 52/69]
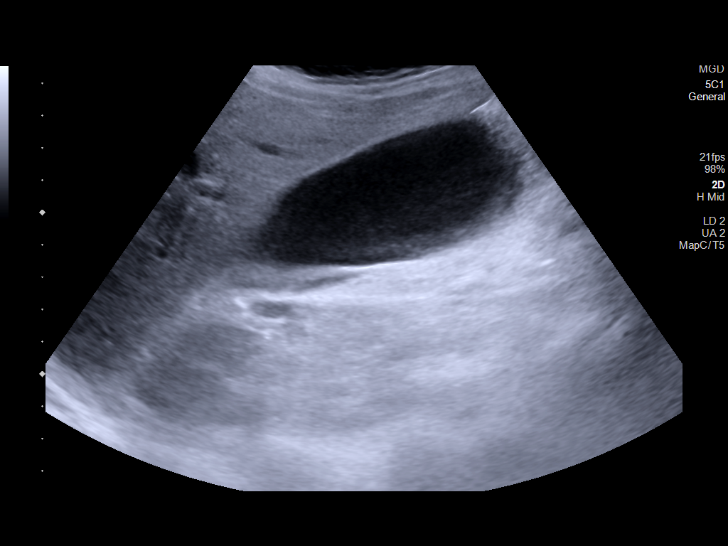
[im 57/69]
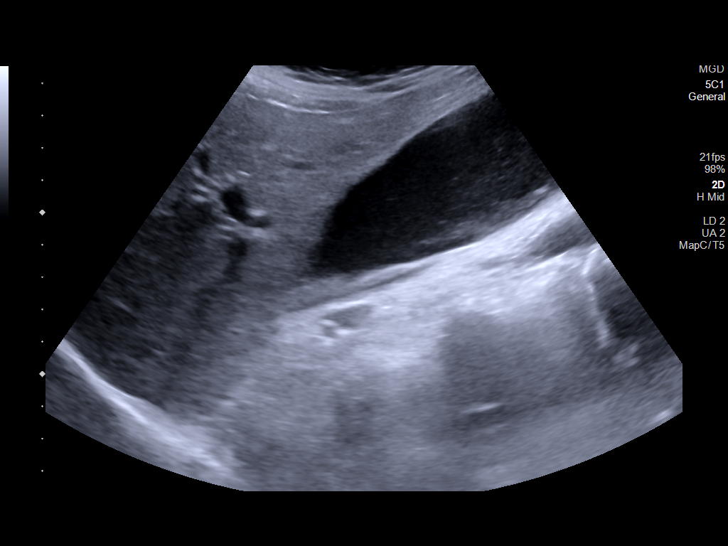
[im 63/69]
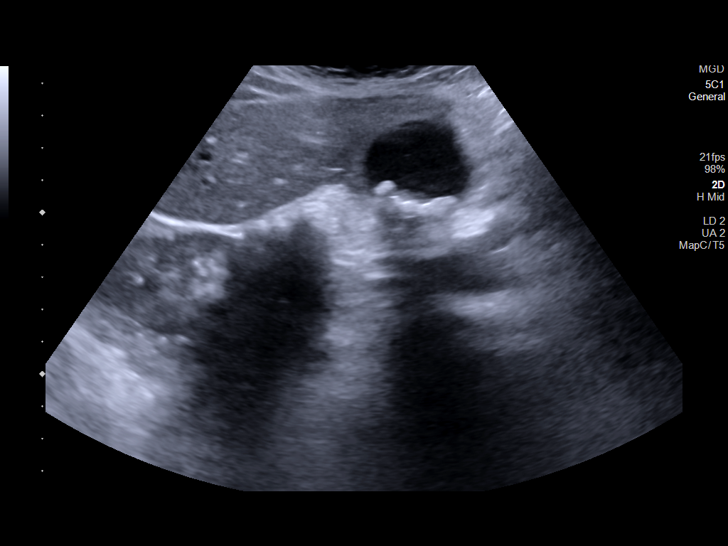
[im 69/69]
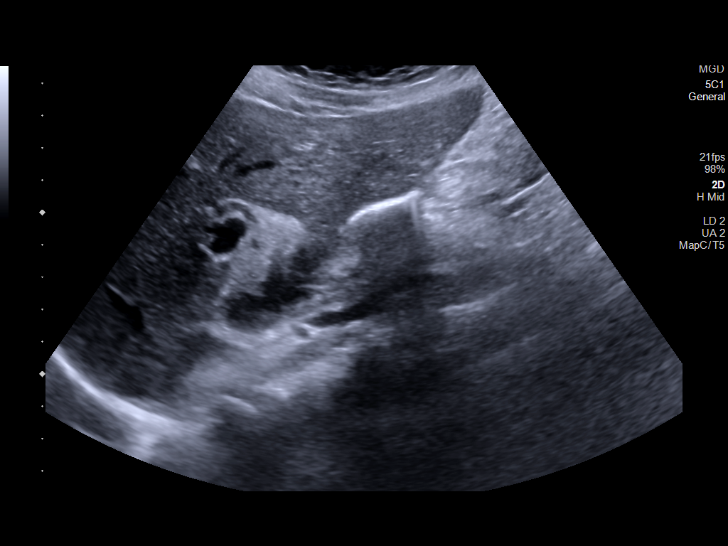

[14 of 25 positions shown; findings below may reference images not displayed]

FINDINGS: Gallbladder:

Positive sonographic Murphy sign is noted by the sonographer. There
is mild thickening of the gallbladder wall. There are multiple
gallstones and sludge noted. Trace pericholecystic fluid.

Common bile duct:

Diameter: Visualized portion measures 5 mm, within normal limits.
The distended portions of the common bile duct seen on CT are not
well visualized sonographically.

Liver:

No focal lesion identified. Within normal limits in parenchymal
echogenicity. Portal vein is patent on color Doppler imaging with
normal direction of blood flow towards the liver.

Other: None.
IMPRESSION: Constellation of findings are consistent with acute cholecystitis.

## 2023-08-29 ENCOUNTER — Other Ambulatory Visit: Payer: Self-pay | Admitting: Family Medicine

## 2023-08-29 DIAGNOSIS — R928 Other abnormal and inconclusive findings on diagnostic imaging of breast: Secondary | ICD-10-CM

## 2023-09-10 ENCOUNTER — Ambulatory Visit
Admission: RE | Admit: 2023-09-10 | Discharge: 2023-09-10 | Disposition: A | Discharge status: Home / Self Care | Payer: Medicare HMO | Source: Ambulatory Visit | Attending: Family Medicine | Admitting: Family Medicine

## 2023-09-10 ENCOUNTER — Ambulatory Visit: Payer: Medicare HMO

## 2023-09-10 DIAGNOSIS — R928 Other abnormal and inconclusive findings on diagnostic imaging of breast: Secondary | ICD-10-CM | POA: Diagnosis not present

## 2023-09-13 ENCOUNTER — Encounter: Payer: Self-pay | Admitting: Cardiovascular Disease

## 2023-09-13 ENCOUNTER — Ambulatory Visit: Payer: Medicare HMO | Attending: Cardiovascular Disease | Admitting: Cardiovascular Disease

## 2023-09-13 VITALS — BP 122/60 | HR 75 | Ht 66.5 in | Wt 162.0 lb

## 2023-09-13 DIAGNOSIS — I4510 Unspecified right bundle-branch block: Secondary | ICD-10-CM | POA: Diagnosis not present

## 2023-09-13 DIAGNOSIS — E782 Mixed hyperlipidemia: Secondary | ICD-10-CM

## 2023-09-13 DIAGNOSIS — R002 Palpitations: Secondary | ICD-10-CM | POA: Diagnosis not present

## 2023-09-13 NOTE — Patient Instructions (Signed)

## 2023-09-13 NOTE — Assessment & Plan Note (Signed)
Statin intolerant.  Being followed by Dr. Rennis Golden in the lipid clinic and treated with "natural alternatives".  Repeat lipid profile scheduled for 2 months after which she will see Dr. Rennis Golden back.

## 2023-09-13 NOTE — Assessment & Plan Note (Signed)
Resolved

## 2023-09-13 NOTE — Progress Notes (Signed)
09/13/2023 Mary Walsh   [birth year]  [id number]  Primary Physician Laurann [place], MD Primary Cardiologist: Runell Gess MD Nicholes [place], [place]  HPI:  Mary Walsh is a 72 y.o.  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 She was referred to me by her PCP, Dr. Cliffton Asters, for evaluation of symptomatic palpitations.  I last saw her in the office 6/12 for.  She basically has no cardiac risk factors other than a mother who has had stents in a father who has had a A. fib and ablation. She is never had a heart attack or stroke. She denies chest pain or shortness of breath. She does drink caffeine in the morning. She works out 3 to 4 days a week without limitation. She developed palpitations in 2019 which at that time were positional when she was on her left side. These have become more frequent. They have awakened her from sleep.    She had a cholecystectomy 03/16/2022 and as result her palpitations have resolved.  She is fairly active.   She did have a coronary calcium score performed 02/09/2023 which was 1.2.  Her most recent lipid profile performed 06/17/2023 revealed a total cholesterol of 201, LDL 131 and HDL 48.  She was started on a statin but noted to have intolerance after several days and with this was discontinued.  She was then referred to Dr. Rennis Golden in the lipid clinic who has pursued more natural alternatives.   Current Meds  Medication Sig   Ascorbic Acid (VITAMIN C PO) Take 950 mg by mouth daily.   Cholecalciferol (DIALYVITE VITAMIN D 5000) 125 MCG (5000 UT) capsule Take 5,000 Units by mouth daily.   Misc Natural Products (LUTEIN VISION BLEND PO) Take 1 capsule by mouth 2 (two) times daily.   Multiple Vitamin (MULTIVITAMIN WITH MINERALS) TABS tablet Take 2 tablets by mouth daily.   OVER THE COUNTER MEDICATION Take 1 capsule by mouth  daily. Methylation complete otc supplement   PRESCRIPTION MEDICATION Estradiol 2mg /Ml Cr - apply 2 clicks to inner thighs twice daily   progesterone (PROMETRIUM) 100 MG capsule Take 200 mg by mouth at bedtime.     Allergies  Allergen Reactions   Chocolate     Migraines    Latex Itching    Per pt interview prior to surgery   Other Other (See Comments)    "Nitrates and other preservatives case severe headaches "    Rosuvastatin Other (See Comments)    5mg  - 3x/week - tired, achy   Sulfites Diarrhea    Severe N/V/D    Codeine     Out of body experience     Social History   Socioeconomic History   Marital status: Married    Spouse name: Not on file   Number of children: Not on file   Years of education: Not on file   Highest education level: Not on file  Occupational History   Not on file  Tobacco Use   Smoking status: Never   Smokeless tobacco: Never  Vaping Use   Vaping status: Never Used  Substance and Sexual Activity   Alcohol use: No   Drug use: No   Sexual activity: Not Currently  Other Topics Concern   Not on file  Social History Narrative   Not on file   Social Drivers of Health   Financial Resource Strain:  Not on file  Food Insecurity: Not on file  Transportation Needs: Not on file  Physical Activity: Not on file  Stress: Not on file  Social Connections: Not on file  Intimate Partner Violence: Not on file     Review of Systems: General: negative for chills, fever, night sweats or weight changes.  Cardiovascular: negative for chest pain, dyspnea on exertion, edema, orthopnea, palpitations, paroxysmal nocturnal dyspnea or shortness of breath Dermatological: negative for rash Respiratory: negative for cough or wheezing Urologic: negative for hematuria Abdominal: negative for nausea, vomiting, diarrhea, bright red blood per rectum, melena, or hematemesis Neurologic: negative for visual changes, syncope, or dizziness All other systems reviewed and are  otherwise negative except as noted above.    Blood pressure 122/60, pulse 75, height 5' 6.5" (1.689 m), weight 162 lb (73.5 kg), SpO2 97%.  General appearance: alert and no distress Neck: no adenopathy, no carotid bruit, no JVD, supple, symmetrical, trachea midline, and thyroid not enlarged, symmetric, no tenderness/mass/nodules Lungs: clear to auscultation bilaterally Heart: regular rate and rhythm, S1, S2 normal, no murmur, click, rub or gallop Extremities: extremities normal, atraumatic, no cyanosis or edema Pulses: 2+ and symmetric Skin: Skin color, texture, turgor normal. No rashes or lesions Neurologic: Grossly normal  EKG EKG Interpretation Date/Time:  Tuesday September 13 2023 13:43:42 EST Ventricular Rate:  73 PR Interval:  132 QRS Duration:  124 QT Interval:  400 QTC Calculation: 440 R Axis:   72  Text Interpretation: Normal sinus rhythm Right bundle branch block No previous ECGs available Confirmed by Nanetta Batty [phone]) on 09/13/2023 1:57:00 PM    ASSESSMENT AND PLAN:   Palpitations Resolved  Hyperlipidemia Statin intolerant.  Being followed by Dr. Rennis Golden in the lipid clinic and treated with "natural alternatives".  Repeat lipid profile scheduled for 2 months after which she will see Dr. Rennis Golden back.  Right bundle branch block Chronic     Runell Gess MD [facility] 09/13/2023 2:04 PM

## 2023-09-13 NOTE — Assessment & Plan Note (Signed)
Chronic. 

## 2023-10-25 LAB — NMR, LIPOPROFILE
Cholesterol, Total: 200 mg/dL — ABNORMAL HIGH (ref 100–199)
HDL Particle Number: 35.6 umol/L (ref >=30.5)
HDL-C: 55 mg/dL (ref >39)
LDL Particle Number: 1394 nmol/L — ABNORMAL HIGH (ref <1000)
LDL Size: 21.7 nmol (ref >20.5)
LDL-C (NIH Calc): 129 mg/dL — ABNORMAL HIGH (ref 0–99)
LP-IR Score: <25 (ref <=45)
Small LDL Particle Number: 398 nmol/L (ref <=527)
Triglycerides: 87 mg/dL (ref 0–149)

## 2023-10-25 LAB — LIPOPROTEIN A (LPA): Lipoprotein (a): 133 nmol/L — ABNORMAL HIGH (ref <75.0)

## 2023-11-01 NOTE — Progress Notes (Signed)
 Cardiology Office Note:  .   Date:  11/02/2023  ID:  Mary Walsh, DOB Sep 08, 1951, MRN [id number] PCP: Teresa Channel, MD  [facility] Providers Cardiologist:  Dorn Lesches, MD    Patient Profile: .      PMH Dyslipidemia Coronary calcium  score of 1.2 in LAD (43rd percentile) Family history of stroke Statin intolerance Myalgia and fatigue  Elevated lipoprotein a Symptomatic palpitations Chronic right bundle branch block  Referred to Advanced Lipid disorder clinic and seen by Dr. Mona on 06/28/23.  She had trialed low-dose statin therapy in the past but had significant side effects from it.  She is interested in natural therapies.  Several alternative therapies including soluble fiber, red yeast rice, plant fiber sterols, and citrus bergamot were all discussed, all of which show some benefit with respect to LDL lowering.  She was advised that red yeast rice could also have statin side effects that she should monitor for.  She wanted to try these alternatives and plan for repeat cholesterol testing in 3 to 4 months.  Given family history of stroke, LP(a) would be checked at that time as well.  She was seen by Dr. Lesches for palpitations in June 2024.   Labs completed 10/24/2023 with LP(a) elevated at 133, LDL particle #1394, LDL-C 870, HDL-C 55, triglycerides 87, and total cholesterol 200.        History of Present Illness: .   Mary Walsh is a very pleasant 73 y.o. female who returns today for follow-up of dyslipidemia.  Reports she is feeling well. She reports a previous intolerance to statins, describing a significant decrease in energy and overall well-being after only five days of use. As an alternative, she has been taking natural supplements, including citrus bergamot and red yeast rice, which she reports have not caused any adverse effects. She also notes an improvement in mood since starting the citrus bergamot. She is very physically active, participating  in various exercises at the local gym, including body balance, active adult, fit and stretch, and Pilates. She also reports walking two to three miles daily. Her diet is primarily composed of non-processed foods, with occasional consumption of sweet tea and red meat. Family history of strokes concerns her in regard to her genetic predisposition to future ASCVD. She expresses a preference for natural treatments and lifestyle modifications over pharmaceutical interventions. Reports palpitations have resolved. She reports they were r/t gallbladder issues, which was resolved with surgery and felt to be secondary to long-term ibuprofen use 2/2 leg fracture. She reports overexposure to electromagnetic frequency in 2020 that occasionally causes a flare in symptoms of body discomfort.     Discussed the use of AI scribe software for clinical note transcription with the patient, who gave verbal consent to proceed.   ROS: See HPI       Studies Reviewed: .        Risk Assessment/Calculations:             Physical Exam:   VS:  BP 138/60   Pulse 84   Ht 5' 6.75 (1.695 m)   Wt 165 lb (74.8 kg)   SpO2 97%   BMI 26.04 kg/m    Wt Readings from Last 3 Encounters:  11/02/23 165 lb (74.8 kg)  09/13/23 162 lb (73.5 kg)  06/28/23 156 lb 12.8 oz (71.1 kg)    GEN: Well nourished, well developed in no acute distress RESPIRATORY:  Normal work of breathing  PSYCH: Normal affect  ASSESSMENT AND PLAN: .    Dyslipidemia LDL goal < 70/Statin intolerance: Lipid panel completed 10/24/2023 revealed LDL particle #1394, LDL-C 870, HDL-C 55, triglycerides 87, small LDL particle 398, total cholesterol 200.  Unfortunately she was also found to have elevated LP(a). We discussed how additional risk factor of elevated LP(a) makes LDL goal 70 or lower.  Current lipid-lowering therapy is red yeast rice, and citrus bergamot.  She is intolerant of statins. Encouraged her to consider trying Livalo. In addition, as noted below,  I have given her information to review on PCSK9 inhibitor therapy as well as bempedoic acid.  She will notify us  if she decides to pursue 1 of these therapies.  Elevated LP(a):  Lipoprotein A is elevated at 133. Lengthy discussion about the genetic  association with increased risk of heart attack and stroke. Advised consideration of PCSK9i that can reduce LP(a) approximately 20%. She would like to review literature and will call back prior to next appointment if she decides to pursue this therapy. Consider participation in future clinical trials for Lipoprotein A lowering agents.  Palpitations: Quiescent. Felt to have been caused by gallbladder and is now s/p cholecystectomy.   Coronary artery calcification: CT calcium  score 1.2 (43rd percentile) in May 2024. She remains active and denies chest pain, dyspnea, or other symptoms concerning for angina.  No indication for further ischemic evaluation at this time. Continue to focus on secondary prevention including heart healthy mostly plant based diet avoiding saturated fat, processed foods, simple carbohydrates, and sugar along with aiming for at least 150 minutes of moderate intensity exercise each week.   Right bundle branch block: Known. EKG from 09/13/2023 did not reveal any concerning abnormality. No indication for further cardiac testing today.        Disposition:6 months with Dr. Mona or me  Signed, Rosaline Bane, NP-C

## 2023-11-02 ENCOUNTER — Encounter (HOSPITAL_BASED_OUTPATIENT_CLINIC_OR_DEPARTMENT_OTHER): Payer: Self-pay | Admitting: Nurse Practitioner

## 2023-11-02 ENCOUNTER — Ambulatory Visit (HOSPITAL_BASED_OUTPATIENT_CLINIC_OR_DEPARTMENT_OTHER): Payer: Medicare HMO | Admitting: Nurse Practitioner

## 2023-11-02 VITALS — BP 138/60 | HR 84 | Ht 66.75 in | Wt 165.0 lb

## 2023-11-02 DIAGNOSIS — E782 Mixed hyperlipidemia: Secondary | ICD-10-CM | POA: Diagnosis not present

## 2023-11-02 DIAGNOSIS — I4510 Unspecified right bundle-branch block: Secondary | ICD-10-CM

## 2023-11-02 DIAGNOSIS — I2510 Atherosclerotic heart disease of native coronary artery without angina pectoris: Secondary | ICD-10-CM | POA: Diagnosis not present

## 2023-11-02 DIAGNOSIS — R002 Palpitations: Secondary | ICD-10-CM

## 2023-11-02 DIAGNOSIS — E7841 Elevated Lipoprotein(a): Secondary | ICD-10-CM

## 2023-11-02 NOTE — Patient Instructions (Signed)
 Medication Instructions:   Your physician recommends that you continue on your current medications as directed. Please refer to the Current Medication list given to you today.   *If you need a refill on your cardiac medications before your next appointment, please call your pharmacy*   Lab Work:  None ordered.  If you have labs (blood work) drawn today and your tests are completely normal, you will receive your results only by: MyChart Message (if you have MyChart) OR A paper copy in the mail If you have any lab test that is abnormal or we need to change your treatment, we will call you to review the results.   Testing/Procedures:  None ordered.   Follow-Up: At [facility], you and your health needs are our priority.  As part of our continuing mission to provide you with exceptional heart care, we have created designated Provider Care Teams.  These Care Teams include your primary Cardiologist (physician) and Advanced Practice Providers (APPs -  Physician Assistants and Nurse Practitioners) who all work together to provide you with the care you need, when you need it.  We recommend signing up for the patient portal called MyChart.  Sign up information is provided on this After Visit Summary.  MyChart is used to connect with patients for Virtual Visits (Telemedicine).  Patients are able to view lab/test results, encounter notes, upcoming appointments, etc.  Non-urgent messages can be sent to your provider as well.   To learn more about what you can do with MyChart, go to [email or web address].    Your next appointment:   6 month(s)  Provider:   K. Chad Hilty, MD    Other Instructions  Your physician wants you to follow-up in: 6 months.  You will receive a reminder letter in the mail two months in advance. If you don't receive a letter, please call our office to schedule the follow-up appointment.

## 2023-11-22 ENCOUNTER — Encounter (HOSPITAL_BASED_OUTPATIENT_CLINIC_OR_DEPARTMENT_OTHER): Payer: Medicare HMO | Admitting: Internal Medicine

## 2023-11-28 DIAGNOSIS — D1801 Hemangioma of skin and subcutaneous tissue: Secondary | ICD-10-CM | POA: Diagnosis not present

## 2023-11-28 DIAGNOSIS — B078 Other viral warts: Secondary | ICD-10-CM | POA: Diagnosis not present

## 2023-11-28 DIAGNOSIS — L6611 Classic lichen planopilaris: Secondary | ICD-10-CM | POA: Diagnosis not present

## 2023-11-28 DIAGNOSIS — L570 Actinic keratosis: Secondary | ICD-10-CM | POA: Diagnosis not present

## 2023-11-28 DIAGNOSIS — D225 Melanocytic nevi of trunk: Secondary | ICD-10-CM | POA: Diagnosis not present

## 2023-11-28 DIAGNOSIS — L814 Other melanin hyperpigmentation: Secondary | ICD-10-CM | POA: Diagnosis not present

## 2023-12-03 ENCOUNTER — Emergency Department (HOSPITAL_BASED_OUTPATIENT_CLINIC_OR_DEPARTMENT_OTHER)
Admission: EM | Admit: 2023-12-03 | Discharge: 2023-12-03 | Disposition: A | Discharge status: Home / Self Care | Attending: Emergency Medicine | Admitting: Emergency Medicine

## 2023-12-03 ENCOUNTER — Emergency Department (HOSPITAL_BASED_OUTPATIENT_CLINIC_OR_DEPARTMENT_OTHER)

## 2023-12-03 ENCOUNTER — Other Ambulatory Visit: Payer: Self-pay

## 2023-12-03 ENCOUNTER — Encounter (HOSPITAL_BASED_OUTPATIENT_CLINIC_OR_DEPARTMENT_OTHER): Payer: Self-pay | Admitting: Emergency Medicine

## 2023-12-03 DIAGNOSIS — R339 Retention of urine, unspecified: Secondary | ICD-10-CM | POA: Diagnosis present

## 2023-12-03 DIAGNOSIS — Z91040 Latex allergy status: Secondary | ICD-10-CM | POA: Diagnosis not present

## 2023-12-03 DIAGNOSIS — N132 Hydronephrosis with renal and ureteral calculous obstruction: Secondary | ICD-10-CM | POA: Diagnosis not present

## 2023-12-03 DIAGNOSIS — D649 Anemia, unspecified: Secondary | ICD-10-CM | POA: Insufficient documentation

## 2023-12-03 DIAGNOSIS — N201 Calculus of ureter: Secondary | ICD-10-CM | POA: Diagnosis not present

## 2023-12-03 DIAGNOSIS — R109 Unspecified abdominal pain: Secondary | ICD-10-CM | POA: Diagnosis not present

## 2023-12-03 DIAGNOSIS — N134 Hydroureter: Secondary | ICD-10-CM | POA: Diagnosis not present

## 2023-12-03 LAB — URINALYSIS, ROUTINE W REFLEX MICROSCOPIC
Bilirubin Urine: NEGATIVE
Glucose, UA: NEGATIVE mg/dL
Hgb urine dipstick: NEGATIVE
Ketones, ur: NEGATIVE mg/dL
Leukocytes,Ua: NEGATIVE
Nitrite: NEGATIVE
Protein, ur: NEGATIVE mg/dL
Specific Gravity, Urine: 1.02 (ref 1.005–1.030)
pH: 8.5 — ABNORMAL HIGH (ref 5.0–8.0)

## 2023-12-03 LAB — COMPREHENSIVE METABOLIC PANEL
ALT: 15 U/L (ref 0–44)
AST: 22 U/L (ref 15–41)
Albumin: 3.6 g/dL (ref 3.5–5.0)
Alkaline Phosphatase: 74 U/L (ref 38–126)
Anion gap: 9 (ref 5–15)
BUN: 19 mg/dL (ref 8–23)
CO2: 25 mmol/L (ref 22–32)
Calcium: 8.6 mg/dL — ABNORMAL LOW (ref 8.9–10.3)
Chloride: 104 mmol/L (ref 98–111)
Creatinine, Ser: 1.03 mg/dL — ABNORMAL HIGH (ref 0.44–1.00)
GFR, Estimated: 58 mL/min — ABNORMAL LOW (ref >60)
Glucose, Bld: 152 mg/dL — ABNORMAL HIGH (ref 70–99)
Potassium: 3.7 mmol/L (ref 3.5–5.1)
Sodium: 138 mmol/L (ref 135–145)
Total Bilirubin: 0.5 mg/dL (ref 0.0–1.2)
Total Protein: 6.5 g/dL (ref 6.5–8.1)

## 2023-12-03 LAB — CBC WITH DIFFERENTIAL/PLATELET
Abs Immature Granulocytes: 0.05 10*3/uL (ref 0.00–0.07)
Basophils Absolute: 0 10*3/uL (ref 0.0–0.1)
Basophils Relative: 0 %
Eosinophils Absolute: 0.1 10*3/uL (ref 0.0–0.5)
Eosinophils Relative: 1 %
HCT: 36.2 % (ref 36.0–46.0)
Hemoglobin: 11.8 g/dL — ABNORMAL LOW (ref 12.0–15.0)
Immature Granulocytes: 1 %
Lymphocytes Relative: 23 %
Lymphs Abs: 1.9 10*3/uL (ref 0.7–4.0)
MCH: 29.4 pg (ref 26.0–34.0)
MCHC: 32.6 g/dL (ref 30.0–36.0)
MCV: 90.3 fL (ref 80.0–100.0)
Monocytes Absolute: 0.5 10*3/uL (ref 0.1–1.0)
Monocytes Relative: 6 %
Neutro Abs: 5.8 10*3/uL (ref 1.7–7.7)
Neutrophils Relative %: 69 %
Platelets: 211 10*3/uL (ref 150–400)
RBC: 4.01 MIL/uL (ref 3.87–5.11)
RDW: 12.8 % (ref 11.5–15.5)
WBC: 8.2 10*3/uL (ref 4.0–10.5)
nRBC: 0 % (ref 0.0–0.2)

## 2023-12-03 LAB — LIPASE, BLOOD: Lipase: 30 U/L (ref 11–51)

## 2023-12-03 MED ORDER — ONDANSETRON 4 MG PO TBDP: 4.0000 mg | ORAL_TABLET | Freq: Three times a day (TID) | ORAL | 0 refills | Status: DC | PRN

## 2023-12-03 MED ORDER — OXYCODONE HCL 5 MG PO TABS
2.5000 mg | ORAL_TABLET | Freq: Four times a day (QID) | ORAL | 0 refills | Status: DC | PRN
Start: 2023-12-03 — End: 2023-12-03

## 2023-12-03 MED ORDER — FENTANYL CITRATE PF 50 MCG/ML IJ SOSY
50.0000 ug | PREFILLED_SYRINGE | Freq: Once | INTRAMUSCULAR | Status: AC
  Administered 2023-12-03: 50 ug via INTRAVENOUS
  Filled 2023-12-03: qty 1

## 2023-12-03 MED ORDER — OXYCODONE HCL 5 MG PO TABS: 2.5000 mg | ORAL_TABLET | Freq: Four times a day (QID) | ORAL | 0 refills | Status: DC | PRN

## 2023-12-03 MED ORDER — OXYCODONE HCL 5 MG PO TABS
2.5000 mg | ORAL_TABLET | Freq: Four times a day (QID) | ORAL | 0 refills | Status: DC | PRN
Start: 2023-12-03 — End: 2024-03-26

## 2023-12-03 MED ORDER — IOHEXOL 300 MG/ML  SOLN
100.0000 mL | Freq: Once | INTRAMUSCULAR | Status: AC | PRN
  Administered 2023-12-03: 100 mL via INTRAVENOUS

## 2023-12-03 MED ORDER — ONDANSETRON HCL 4 MG/2ML IJ SOLN
4.0000 mg | Freq: Once | INTRAMUSCULAR | Status: AC
  Administered 2023-12-03: 4 mg via INTRAVENOUS
  Filled 2023-12-03: qty 2

## 2023-12-03 NOTE — ED Notes (Addendum)
 Dr. Wallace Cullens in Triage to evauluate Pts abd due to being unable to clearly view bladder on bladder scanner; in combination with pts s/s. Pt still complaining of the same distention, pain and unable to completely empty after voiding.

## 2023-12-03 NOTE — ED Provider Notes (Signed)
 [place] [facility] AT [facility] Provider Note   CSN: [id number] Arrival date & time: 12/03/23  1828     History  Chief Complaint  Patient presents with   Back Pain   Urinary Retention    Mary Walsh is a 73 y.o. female.   Back Pain   73 year old female presents emergency department with complaints of abdominal pain, feelings of urinary retention.  Patient states the symptoms began this afternoon after lunchtime.  Reports pain diffusely in her abdomen with some radiation to her left low back.  Reports feelings of nausea with no emesis.  States that she had the urge to pee but could not urinate feeling like she could not empty her bladder.  Denies any fevers, chills, change in bowel habits.  Denies history of similar abdominal pain in the past.  Denies history of kidney stones.  States she has history of laparoscopic cholecystectomy a couple years ago but no other abdominal surgeries.  Last colonoscopy 10 years ago and normal per patient.  Past medical history significant for GERD, pneumonia, right Byrnett branch block, hyperlipidemia  Home Medications Prior to Admission medications   Medication Sig Start Date End Date Taking? Authorizing Provider  Ascorbic Acid (VITAMIN C PO) Take 950 mg by mouth daily.    [provider]  Cholecalciferol (DIALYVITE VITAMIN D 5000) 125 MCG (5000 UT) capsule Take 5,000 Units by mouth daily.    [provider]  estradiol (ESTRACE) 0.1 MG/GM vaginal cream apply 2 clicks twice daily 10/29/23   [provider]  Misc Natural Products (LUTEIN VISION BLEND PO) Take 1 capsule by mouth 2 (two) times daily.    [provider]  Multiple Vitamin (MULTIVITAMIN WITH MINERALS) TABS tablet Take 2 tablets by mouth daily.    [provider]  ondansetron (ZOFRAN-ODT) 4 MG disintegrating tablet Take 1 tablet (4 mg total) by mouth every 8 (eight) hours as needed. 12/03/23   Peter Garter, PA   OVER THE COUNTER MEDICATION Take 1 capsule by mouth daily. Methylation complete otc supplement    [provider]  oxyCODONE (ROXICODONE) 5 MG immediate release tablet Take 0.5 tablets (2.5 mg total) by mouth every 6 (six) hours as needed for severe pain (pain score 7-10). 12/03/23   Peter Garter, PA  PRESCRIPTION MEDICATION Estradiol 2mg /Ml Cr - apply 2 clicks to inner thighs twice daily    [provider]  progesterone (PROMETRIUM) 100 MG capsule Take 200 mg by mouth at bedtime. 06/24/20   [provider]  Red Yeast Rice Extract (RED YEAST RICE PO) Take by mouth.    [provider]      Allergies    Chocolate, Latex, Other, Rosuvastatin, Sulfites, and Codeine    Review of Systems   Review of Systems  Musculoskeletal:  Positive for back pain.  All other systems reviewed and are negative.   Physical Exam Updated Vital Signs BP 135/71   Pulse 80   Temp 98.5 F (36.9 C)   Resp 16   Ht 5\' 7"  (1.702 m)   Wt 72.1 kg   SpO2 100%   BMI 24.90 kg/m  Physical Exam Vitals and nursing note reviewed.  Constitutional:      General: She is not in acute distress.    Appearance: She is well-developed.  HENT:     Head: Normocephalic and atraumatic.  Eyes:     Conjunctiva/sclera: Conjunctivae normal.  Cardiovascular:     Rate and Rhythm: Normal rate  and regular rhythm.     Heart sounds: No murmur heard. Pulmonary:     Effort: Pulmonary effort is normal. No respiratory distress.     Breath sounds: Normal breath sounds.  Abdominal:     Palpations: Abdomen is soft.     Tenderness: There is abdominal tenderness.  Musculoskeletal:        General: No swelling.     Cervical back: Neck supple.  Skin:    General: Skin is warm and dry.     Capillary Refill: Capillary refill takes less than 2 seconds.  Neurological:     Mental Status: She is alert.  Psychiatric:        Mood and Affect: Mood normal.     ED Results / Procedures / Treatments    Labs (all labs ordered are listed, but only abnormal results are displayed) Labs Reviewed  CBC WITH DIFFERENTIAL/PLATELET - Abnormal; Notable for the following components:      Result Value   Hemoglobin 11.8 (*)    All other components within normal limits  COMPREHENSIVE METABOLIC PANEL - Abnormal; Notable for the following components:   Glucose, Bld 152 (*)    Creatinine, Ser 1.03 (*)    Calcium 8.6 (*)    GFR, Estimated 58 (*)    All other components within normal limits  URINALYSIS, ROUTINE W REFLEX MICROSCOPIC - Abnormal; Notable for the following components:   pH 8.5 (*)    All other components within normal limits  LIPASE, BLOOD    EKG None  Radiology CT ABDOMEN PELVIS W CONTRAST Result Date: 12/03/2023 CLINICAL DATA:  Acute nonlocalized abdominal pain EXAM: CT ABDOMEN AND PELVIS WITH CONTRAST TECHNIQUE: Multidetector CT imaging of the abdomen and pelvis was performed using the standard protocol following bolus administration of intravenous contrast. RADIATION DOSE REDUCTION: This exam was performed according to the departmental dose-optimization program which includes automated exposure control, adjustment of the mA and/or kV according to patient size and/or use of iterative reconstruction technique. CONTRAST:  OMNIPAQUE IOHEXOL 300 MG/ML  SOLN COMPARISON:  None Available. FINDINGS: Lower chest: No acute abnormality. Hepatobiliary: No focal liver abnormality is seen. Status post cholecystectomy. No biliary dilatation. Pancreas: Unremarkable Spleen: Unremarkable Adrenals/Urinary Tract: The adrenal glands are unremarkable. The kidneys are normal in size and position. Slightly delayed left renal cortical enhancement. Mild left hydronephrosis and hydroureter to the level of the left ureterovesicular junction where an obstructing 3 mm calculus is seen. No additional renal or ureteral calculi. No hydronephrosis on the right. No enhancing intrarenal masses. No perinephric fluid  collections are seen. Bladder is decompressed. Stomach/Bowel: Stomach is within normal limits. Appendix appears normal. No evidence of bowel wall thickening, distention, or inflammatory changes. Vascular/Lymphatic: Aortic atherosclerosis. No enlarged abdominal or pelvic lymph nodes. Reproductive: Uterus and bilateral adnexa are unremarkable. Other: No abdominal wall hernia or abnormality. No abdominopelvic ascites. Musculoskeletal: No acute or significant osseous findings. IMPRESSION: 1. Obstructing 3 mm left ureterovesicular junction calculus resulting in mild left hydronephrosis and hydroureter. Aortic Atherosclerosis (ICD10-I70.0). Electronically Signed   By: Helyn Numbers M.D.   On: 12/03/2023 20:52    Procedures Procedures    Medications Ordered in ED Medications  fentaNYL (SUBLIMAZE) injection 50 mcg (50 mcg Intravenous Given 12/03/23 1930)  ondansetron (ZOFRAN) injection 4 mg (4 mg Intravenous Given 12/03/23 1930)  iohexol (OMNIPAQUE) 300 MG/ML solution 100 mL (100 mLs Intravenous Contrast Given 12/03/23 2031)    ED Course/ Medical Decision Making/ A&P  Medical Decision Making Amount and/or Complexity of Data Reviewed Labs: ordered. Radiology: ordered.  Risk Prescription drug management.   This patient presents to the ED for concern of abdominal pain, this involves an extensive number of treatment options, and is a complaint that carries with it a high risk of complications and morbidity.  The differential diagnosis includes gastritis, CBD pathology, PUD, pancreatitis, SBO/LBO, with, diverticulitis, appendicitis, pyelonephritis, nephrolithiasis, urinary retention, other   Co morbidities that complicate the patient evaluation  See HPI   Additional history obtained:  Additional history obtained from EMR External records from outside source obtained and reviewed including hospital records   Lab Tests:  I Ordered, and personally interpreted  labs.  The pertinent results include: No leukocytosis.  Anemia with a hemoglobin 11.8.  Platelets within range.  Mild hypocalcemia of 8.6 otherwise electrolytes within normal limits.  UA without abnormality.  Lipase within normal limits.   Imaging Studies ordered:  I ordered imaging studies including CT abdomen pelvis I independently visualized and interpreted imaging which showed obstructing left 3 mm stone at the ureterovesicular junction I agree with the radiologist interpretation   Cardiac Monitoring: / EKG:  The patient was maintained on a cardiac monitor.  I personally viewed and interpreted the cardiac monitored which showed an underlying rhythm of: Sinus rhythm   Consultations Obtained:  N/a   Problem List / ED Course / Critical interventions / Medication management  Ureterolithiasis I ordered medication including Zofran, fentanyl   Reevaluation of the patient after these medicines showed that the patient improved I have reviewed the patients home medicines and have made adjustments as needed   Social Determinants of Health:  Denies tobacco, illicit drug use.   Test / Admission - Considered:  Urolithiasis Vitals signs within normal range and stable throughout visit. Laboratory/imaging studies significant for: See above 73 year old female presents emergency department with complaints of abdominal pain, feelings of urinary retention.  Patient states the symptoms began this afternoon after lunchtime.  Reports pain diffusely in her abdomen with some radiation to her left low back.  Reports feelings of nausea with no emesis.  States that she had the urge to pee but could not urinate feeling like she could not empty her bladder.  Denies any fevers, chills, change in bowel habits.  Denies history of similar abdominal pain in the past.  Denies history of kidney stones.  States she has history of laparoscopic cholecystectomy a couple years ago but no other abdominal surgeries.   Last colonoscopy 10 years ago and normal per patient. On exam, patient with tenderness mostly left lower quadrant soft left CVA tenderness.  Laboratory studies concerning for slight elevation in creatinine of 1 point of 3 but without labs for the past 1+ years.  UA without evidence of infection.  CT scan did show evidence of 3 mm stone about the past at the ureterovesicular junction which is most likely causing symptoms..  Patient reassured by findings.  Will control patient's symptoms and have her follow-up with urology in the outpatient setting.  Recommend returning if development of fever, tractable pain/nausea or other abnormality discussed in AVS.  Treatment plan discussed at length with patient and she acknowledged understanding was agreeable to said plan.  Patient overall well-appearing, afebrile in no acute distress. Worrisome signs and symptoms were discussed with the patient, and the patient acknowledged understanding to return to the ED if noticed. Patient was stable upon discharge.          Final Clinical Impression(s) / ED  Diagnoses Final diagnoses:  Ureterolithiasis    Rx / DC Orders ED Discharge Orders          Ordered    oxyCODONE (ROXICODONE) 5 MG immediate release tablet  Every 6 hours PRN,   Status:  Discontinued        12/03/23 2107    ondansetron (ZOFRAN-ODT) 4 MG disintegrating tablet  Every 8 hours PRN,   Status:  Discontinued        12/03/23 2107    ondansetron (ZOFRAN-ODT) 4 MG disintegrating tablet  Every 8 hours PRN        12/03/23 2114    oxyCODONE (ROXICODONE) 5 MG immediate release tablet  Every 6 hours PRN        12/03/23 2114              Peter Garter, PA 12/03/23 2121    Edwin Dada P, DO 12/04/23 1459

## 2023-12-03 NOTE — ED Triage Notes (Signed)
 Pt POV steady gait- c/o L lower back pain, also reports suprapubic pain and difficulty being able to urinate. Reports UTI like sx, such as difficulty starting stream, unable to empty bladder fully for last week.   LBM yesterday, normal.   Took hydrocodone appx 1 hr PTA.

## 2023-12-03 NOTE — Discharge Instructions (Addendum)
 As discussed, CT scan did show you have a left-sided kidney stone which is most likely causing symptoms. Will recommend use of tylenol for your baseline pain with the prescribed pain medicine for your break through pain.  Also send you with nausea medicine to use as needed.  Recommend following up with urology in the outpatient setting.  Test visit number to call to schedule an appointment.  Please do not hesitate to return to the emergency department if you develop intractable pain, intractable nausea, fever or other abnormality we discussed.

## 2023-12-05 DIAGNOSIS — N132 Hydronephrosis with renal and ureteral calculous obstruction: Secondary | ICD-10-CM | POA: Diagnosis not present

## 2023-12-05 DIAGNOSIS — N201 Calculus of ureter: Secondary | ICD-10-CM | POA: Diagnosis not present

## 2024-02-22 DIAGNOSIS — F4320 Adjustment disorder, unspecified: Secondary | ICD-10-CM | POA: Diagnosis not present

## 2024-02-25 DIAGNOSIS — E785 Hyperlipidemia, unspecified: Secondary | ICD-10-CM | POA: Diagnosis not present

## 2024-02-28 DIAGNOSIS — F4320 Adjustment disorder, unspecified: Secondary | ICD-10-CM | POA: Diagnosis not present

## 2024-03-12 DIAGNOSIS — R55 Syncope and collapse: Secondary | ICD-10-CM | POA: Diagnosis not present

## 2024-03-16 ENCOUNTER — Telehealth: Payer: Self-pay | Admitting: Cardiovascular Disease

## 2024-03-16 NOTE — Telephone Encounter (Signed)
 Spoke with patient and she states she fainted on Monday and felt her heart was out of rhythm. She went to urgent care but while waiting heart went back into rhythm. She is unable to give any vital signs at this time. Asymptomatic today.  Appointment scheduled with DOD on Monday. ED precautions discussed

## 2024-03-16 NOTE — Telephone Encounter (Signed)
 Patient states that she fainted on Monday and then on her way home her heart was out of rhythm. They went to urgent care but while waiting her heart got back into rhythm. Patient calling to see if she needs to have any type of test ran, to see what going on. Please advise

## 2024-03-19 DIAGNOSIS — F4320 Adjustment disorder, unspecified: Secondary | ICD-10-CM | POA: Diagnosis not present

## 2024-03-22 DIAGNOSIS — Z1211 Encounter for screening for malignant neoplasm of colon: Secondary | ICD-10-CM | POA: Diagnosis not present

## 2024-03-26 ENCOUNTER — Encounter: Payer: Self-pay | Admitting: Cardiovascular Disease

## 2024-03-26 ENCOUNTER — Ambulatory Visit: Attending: Cardiovascular Disease | Admitting: Cardiovascular Disease

## 2024-03-26 VITALS — BP 134/68 | HR 66 | Ht 67.0 in | Wt 162.6 lb

## 2024-03-26 DIAGNOSIS — R6884 Jaw pain: Secondary | ICD-10-CM

## 2024-03-26 DIAGNOSIS — R55 Syncope and collapse: Secondary | ICD-10-CM | POA: Diagnosis not present

## 2024-03-26 DIAGNOSIS — E785 Hyperlipidemia, unspecified: Secondary | ICD-10-CM | POA: Diagnosis not present

## 2024-03-26 DIAGNOSIS — I4510 Unspecified right bundle-branch block: Secondary | ICD-10-CM

## 2024-03-26 DIAGNOSIS — R002 Palpitations: Secondary | ICD-10-CM

## 2024-03-26 DIAGNOSIS — E782 Mixed hyperlipidemia: Secondary | ICD-10-CM

## 2024-03-26 NOTE — Patient Instructions (Signed)
 Medication Instructions:   No changes *If you need a refill on your cardiac medications before your next appointment, please call your pharmacy*   Lab Work: Not needed    Testing/Procedures: 1)Your doctor has scheduled you for a Myocardial Perfusion scan  to obtain information about the blood flow to your heart. The test consists of taking pictures of your heart in two phases: while resting and after a stress test.  The stress test may involve walking on a treadmill, or if you are unable to exercise adequately, you will be given a drug intended to have a similar effect on the heart to that of exercise.  The test will take approximately 3 to 4  hours to complete.  t.  How to prepare for your test: Do not eat or drink 2 hours prior to your test Do not consume products containing caffeine 12 hours prior to your test (examples: coffee (regular OR decaf), chocolate, sodas, tea) Your doctor may need you to hold certain medications prior to the test.  If so, these are listed below and should not be taken for 24 hours prior to the test.  If not listed below, you may take your medications as normal.  You may resume taking held medications on your normal schedule once the test is complete.   Meds to hold: none Do bring a list of your current medications with you.  If you have held any meds in preparation for the test, please bring them, as you may be required to take them once the test is completed. Do wear comfortable clothes and walking shoes.  Do not wear dresses or overalls. Do NOT wear cologne, perfume, aftershave, or fragranced lotions the day of your test (deodorants okay). If these instructions are not followed your test will have to be rescheduled.   A nuclear cardiologist will review your test, prepare a report and send it to your physician.   If you have questions or concerns about your appointment, you can call the Nuclear Cardiology department at [phone]. If you cannot keep your  appointment, please provide 48 hours notification to avoid a possible $50.00 charge to your account.   Please arrive 15 minutes prior to your appointment time for registration and insurance purposes         2) Your physician has recommended that you wear an event monitor 30 day [employer]. Event monitors are medical devices that record the heart's electrical activity. Doctors most often us  these monitors to diagnose arrhythmias. Arrhythmias are problems with the speed or rhythm of the heartbeat. The monitor is a small, portable device. You can wear one while you do your normal daily activities. This is usually used to diagnose what is causing palpitations/syncope (passing out).    Follow-Up: At [facility], you and your health needs are our priority.  As part of our continuing mission to provide you with exceptional heart care, we have created designated Provider Care Teams.  These Care Teams include your primary Cardiologist (physician) and Advanced Practice Providers (APPs -  Physician Assistants and Nurse Practitioners) who all work together to provide you with the care you need, when you need it.     Your next appointment:   3 month(s)  The format for your next appointment:   In Person  Provider:   Dorn Lesches, MD   Other Instructions   [employer] Cardiac Event Monitor Instructions  Your physician has requested you wear your cardiac event monitor for _30____ days, (1-30). [employer] may  call or text to confirm a shipping address. The monitor will be sent to a land address via UPS. [employer] will not ship a monitor to a PO BOX. It typically takes 3-5 days to receive your monitor after it has been enrolled. [employer] will assist with USPS tracking if your package is delayed. The telephone number for [employer] is [phone]. Once you have received your monitor, please review the enclosed instructions. Instruction tutorials can also be viewed under help and settings  on the enclosed cell phone. Your monitor has already been registered assigning a specific monitor serial # to you.  Billing and Self Pay Discount Information  [employer] has been provided the insurance information we had on file for you.  If your insurance has been updated, please call [employer] at [phone] to provide them with your updated insurance information.   [employer] offers a discounted Self Pay option for patients who have insurance that does not cover their cardiac event monitor or patients without insurance.  The discounted cost of a Self Pay Cardiac Event Monitor would be $225.00 , if the patient contacts [employer] at [phone] within 7 days of applying the monitor to make payment arrangements.  If the patient does not contact [employer] within 7 days of applying the monitor, the cost of the cardiac event monitor will be $350.00.  Applying the monitor  Remove cell phone from case and turn it on. The cell phone works as IT consultant and needs to be within [employer] of you at all times. The cell phone will need to be charged on a daily basis. We recommend you plug the cell phone into the enclosed charger at your bedside table every night.  Monitor batteries: You will receive two monitor batteries labelled #1 and #2. These are your recorders. Plug battery #2 onto the second connection on the enclosed charger. Keep one battery on the charger at all times. This will keep the monitor battery deactivated. It will also keep it fully charged for when you need to switch your monitor batteries. A small light will be blinking on the battery emblem when it is charging. The light on the battery emblem will remain on when the battery is fully charged.  Open package of a Monitor strip. Insert battery #1 into black hood on strip and gently squeeze monitor battery onto connection as indicated in instruction booklet. Set aside while preparing skin.  Choose location for your strip,  vertical or horizontal, as indicated in the instruction booklet. Shave to remove all hair from location. There cannot be any lotions, oils, powders, or colognes on skin where monitor is to be applied. Wipe skin clean with enclosed Saline wipe. Dry skin completely.  Peel paper labeled #1 off the back of the Monitor strip exposing the adhesive. Place the monitor on the chest in the vertical or horizontal position shown in the instruction booklet. One arrow on the monitor strip must be pointing upward. Carefully remove paper labeled #2, attaching remainder of strip to your skin. Try not to create any folds or wrinkles in the strip as you apply it.  Firmly press and release the circle in the center of the monitor battery. You will hear a small beep. This is turning the monitor battery on. The heart emblem on the monitor battery will light up every 5 seconds if the monitor battery in turned on and connected to the patient securely. Do not push and hold the circle down as this turns the monitor battery off. The cell phone  will locate the monitor battery. A screen will appear on the cell phone checking the connection of your monitor strip. This may read poor connection initially but change to good connection within the next minute. Once your monitor accepts the connection you will hear a series of 3 beeps followed by a climbing crescendo of beeps. A screen will appear on the cell phone showing the two monitor strip placement options. Touch the picture that demonstrates where you applied the monitor strip.  Your monitor strip and battery are waterproof. You are able to shower, bathe, or swim with the monitor on. They just ask you do not submerge deeper than 3 feet underwater. We recommend removing the monitor if you are swimming in a lake, river, or ocean.  Your monitor battery will need to be switched to a fully charged monitor battery approximately once a week. The cell phone will alert you of an  action which needs to be made.  On the cell phone, tap for details to reveal connection status, monitor battery status, and cell phone battery status. The green dots indicates your monitor is in good status. A red dot indicates there is something that needs your attention.  To record a symptom, click the circle on the monitor battery. In 30-60 seconds a list of symptoms will appear on the cell phone. Select your symptom and tap save. Your monitor will record a sustained or significant arrhythmia regardless of you clicking the button. Some patients do not feel the heart rhythm irregularities. [employer] will notify us  of any serious or critical events.  Refer to instruction booklet for instructions on switching batteries, changing strips, the Do not disturb or Pause features, or any additional questions.  Call [employer] at [phone], to confirm your monitor is transmitting and record your baseline. They will answer any questions you may have regarding the monitor instructions at that time.  Returning the monitor to [employer]  Place all equipment back into blue box. Peel off strip of paper to expose adhesive and close box securely. There is a prepaid UPS shipping label on this box. Drop in a UPS drop box, or at a UPS facility like [employer]. You may also contact [employer] to arrange UPS to pick up monitor package at your home.

## 2024-03-26 NOTE — Assessment & Plan Note (Signed)
 History of hyperlipidemia intolerant to statin therapy currently on red yeast rice.  She sees Dr. Mona in the lipid clinic.  Her most recent lipid profile performed 02/15/2024 revealing total cholesterol 211, LDL of 139 and HDL of 54.

## 2024-03-26 NOTE — Assessment & Plan Note (Signed)
 Chronic

## 2024-03-26 NOTE — Assessment & Plan Note (Signed)
 Patient had what sounds like a near syncopal episode approxi-2 weeks ago.  She had not felt well for the several days prior.  This was witnessed and she was caught before she hit the ground.  Her daughter, who is a cardiac nurse, met her in the urgent care center and thought that she had experienced an arrhythmia and had converted before being seen.  Her orthostatic vital signs today are unremarkable.  I am going to obtain a 30-day Zio patch.  If she has recurrent episode she will need a loop recorder implant.  She did mention that during this episode she had bilateral jaw pain.  This could have been ischemically mediated.  I am going to get an exercise Myoview to further evaluate.

## 2024-03-26 NOTE — Progress Notes (Signed)
 03/26/2024 Mary Walsh   [birth year]  [id number]  Primary Physician Mary Channel, MD Primary Cardiologist: Mary Walsh Mary Lesches MD Mary Walsh, [place]  HPI:  Mary Walsh is a 73 y.o.   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 She was referred to me by her PCP, Dr. Teresa, for evaluation of symptomatic palpitations.  She is accompanied by her daughter Mary Walsh today who currently works as a Therapist, music but has been a cardiac nurse in the past.  I last saw her in the office 09/13/2023.  She basically has no cardiac risk factors other than a mother who has had stents in a father who has had a A. fib and ablation. She is never had a heart attack or stroke. She denies chest pain or shortness of breath. She does drink caffeine in the morning. She works out 3 to 4 days a week without limitation. She developed palpitations in 2019 which at that time were positional when she was on her left side. These have become more frequent. They have awakened her from sleep.    She had a cholecystectomy 03/16/2022 and as result her palpitations have resolved.  She is fairly active.   She did have a coronary calcium  score performed 02/09/2023 which was 1.2.  Her most recent lipid profile performed 02/15/2024 revealed a total cholesterol of 211, LDL 139 HDL 54.Mary Walsh  She was started on a statin but noted to have intolerance after several days and with this was discontinued.  She was then referred to Dr. Mona in the lipid clinic who has pursued more natural alternatives.  Since I saw her 6 months ago she has had a witnessed syncopal episode 2 weeks ago.  She was caught before she hit the ground.  This was while she was at church.  She was seen in urgent care without diagnosis.  Her daughter Mary Walsh accompany her there and thought that she may have had an arrhythmia.  She did also  complain of bilateral jaw pain.  She has had no further episodes.   Current Meds  Medication Sig   Ascorbic Acid (VITAMIN C PO) Take 950 mg by mouth daily.   Cholecalciferol (DIALYVITE VITAMIN D 5000) 125 MCG (5000 UT) capsule Take 5,000 Units by mouth daily.   estradiol  (ESTRACE ) 0.1 MG/GM vaginal cream apply 2 clicks twice daily   Misc Natural Products (LUTEIN VISION BLEND PO) Take 1 capsule by mouth 2 (two) times daily.   Multiple Vitamin (MULTIVITAMIN WITH MINERALS) TABS tablet Take 2 tablets by mouth daily.   OVER THE COUNTER MEDICATION Take 1 capsule by mouth daily. Methylation complete otc supplement   PRESCRIPTION MEDICATION Estradiol  2mg /Ml Cr - apply 2 clicks to inner thighs twice daily   progesterone  (PROMETRIUM ) 100 MG capsule Take 200 mg by mouth at bedtime.   Red Yeast Rice Extract (RED YEAST RICE PO) Take by mouth.     Allergies  Allergen Reactions   Chocolate     Migraines    Latex Itching    Per pt interview prior to surgery   Other Other (See Comments)    Nitrates and other preservatives case severe headaches     Rosuvastatin  Other (See Comments)    5mg  - 3x/week - tired, achy   Sulfites Diarrhea    Severe N/V/D    Codeine     Out of body experience  Social History   Socioeconomic History   Marital status: Married    Spouse name: Not on file   Number of children: Not on file   Years of education: Not on file   Highest education level: Not on file  Occupational History   Not on file  Tobacco Use   Smoking status: Never   Smokeless tobacco: Never  Vaping Use   Vaping status: Never Used  Substance and Sexual Activity   Alcohol use: No   Drug use: No   Sexual activity: Not Currently  Other Topics Concern   Not on file  Social History Narrative   Not on file   Social Drivers of Health   Financial Resource Strain: Not on file  Food Insecurity: Not on file  Transportation Needs: Not on file  Physical Activity: Not on file  Stress: Not on  file  Social Connections: Not on file  Intimate Partner Violence: Not on file     Review of Systems: General: negative for chills, fever, night sweats or weight changes.  Cardiovascular: negative for chest pain, dyspnea on exertion, edema, orthopnea, palpitations, paroxysmal nocturnal dyspnea or shortness of breath Dermatological: negative for rash Respiratory: negative for cough or wheezing Urologic: negative for hematuria Abdominal: negative for nausea, vomiting, diarrhea, bright red blood per rectum, melena, or hematemesis Neurologic: negative for visual changes, syncope, or dizziness All other systems reviewed and are otherwise negative except as noted above.    Blood pressure 134/68, pulse 66, height 5' 7 (1.702 m), weight 162 lb 9.6 oz (73.8 kg), SpO2 100%.  General appearance: alert and no distress Neck: no adenopathy, no carotid bruit, no JVD, supple, symmetrical, trachea midline, and thyroid  not enlarged, symmetric, no tenderness/mass/nodules Lungs: clear to auscultation bilaterally Heart: regular rate and rhythm, S1, S2 normal, no murmur, click, rub or gallop Extremities: extremities normal, atraumatic, no cyanosis or edema Pulses: 2+ and symmetric Skin: Skin color, texture, turgor normal. No rashes or lesions Neurologic: Grossly normal  EKG not performed today      ASSESSMENT AND PLAN:   Palpitations History of palpitations in the past with a negative Zio patch.  Palpitations resolved after performing cholecystectomy.  Hyperlipidemia History of hyperlipidemia intolerant to statin therapy currently on red yeast rice.  She sees Dr. Mona in the lipid clinic.  Her most recent lipid profile performed 02/15/2024 revealing total cholesterol 211, LDL of 139 and HDL of 54.  Right bundle branch block Chronic  Syncope and collapse Patient had what sounds like a near syncopal episode approxi-2 weeks ago.  She had not felt well for the several days prior.  This was witnessed  and she was caught before she hit the ground.  Her daughter, who is a cardiac nurse, met her in the urgent care center and thought that she had experienced an arrhythmia and had converted before being seen.  Her orthostatic vital signs today are unremarkable.  I am going to obtain a 30-day Zio patch.  If she has recurrent episode she will need a loop recorder implant.  She did mention that during this episode she had bilateral jaw pain.  This could have been ischemically mediated.  I am going to get an exercise Myoview to further evaluate.     Mary Walsh DOROTHA Lesches MD FACP,FACC,FAHA, [facility] 03/26/2024 2:01 PM

## 2024-03-26 NOTE — Assessment & Plan Note (Signed)
 History of palpitations in the past with a negative Zio patch.  Palpitations resolved after performing cholecystectomy.

## 2024-03-27 ENCOUNTER — Telehealth (HOSPITAL_COMMUNITY): Payer: Self-pay

## 2024-03-27 ENCOUNTER — Other Ambulatory Visit: Payer: Self-pay | Admitting: *Deleted

## 2024-03-27 DIAGNOSIS — R6884 Jaw pain: Secondary | ICD-10-CM

## 2024-03-27 NOTE — Addendum Note (Signed)
 Addended by: COURT DORN PARAS on: 03/27/2024 01:53 PM   Modules accepted: Orders

## 2024-03-27 NOTE — Telephone Encounter (Signed)
 Detailed instructions left on the patient's answering machine. Mary Walsh CCT

## 2024-03-27 NOTE — Addendum Note (Signed)
 Addended by: GLADIS REENA GAILS on: 03/27/2024 01:38 PM   Modules accepted: Orders

## 2024-03-28 ENCOUNTER — Other Ambulatory Visit: Payer: Self-pay | Admitting: Cardiovascular Disease

## 2024-03-28 DIAGNOSIS — R002 Palpitations: Secondary | ICD-10-CM

## 2024-03-28 DIAGNOSIS — R55 Syncope and collapse: Secondary | ICD-10-CM

## 2024-03-28 DIAGNOSIS — I4510 Unspecified right bundle-branch block: Secondary | ICD-10-CM

## 2024-03-28 DIAGNOSIS — E782 Mixed hyperlipidemia: Secondary | ICD-10-CM

## 2024-04-02 DIAGNOSIS — F4320 Adjustment disorder, unspecified: Secondary | ICD-10-CM | POA: Diagnosis not present

## 2024-04-03 ENCOUNTER — Ambulatory Visit (HOSPITAL_COMMUNITY)
Admission: RE | Admit: 2024-04-03 | Discharge: 2024-04-03 | Disposition: A | Discharge status: Home / Self Care | Source: Ambulatory Visit | Attending: Cardiology | Admitting: Cardiology

## 2024-04-03 DIAGNOSIS — R55 Syncope and collapse: Secondary | ICD-10-CM | POA: Insufficient documentation

## 2024-04-03 DIAGNOSIS — I4510 Unspecified right bundle-branch block: Secondary | ICD-10-CM | POA: Insufficient documentation

## 2024-04-03 DIAGNOSIS — R002 Palpitations: Secondary | ICD-10-CM | POA: Insufficient documentation

## 2024-04-03 DIAGNOSIS — E782 Mixed hyperlipidemia: Secondary | ICD-10-CM | POA: Diagnosis not present

## 2024-04-03 LAB — MYOCARDIAL PERFUSION IMAGING
Angina Index: 0
Base ST Depression (mm): 0 mm
Duke Treadmill Score: 8
Estimated workload: 7.9
Exercise duration (min): 8 min
Exercise duration (sec): 1 s
LV dias vol: 76 mL (ref 46–106)
LV sys vol: 29 mL (ref 3.8–5.2)
MPHR: 150 {beats}/min
Nuc Stress EF: 62 %
Peak HR: 139 {beats}/min
Percent HR: 92 %
Rest HR: 62 {beats}/min
Rest Nuclear Isotope Dose: 10.3 mCi
SDS: 0
SRS: 0
SSS: 0
ST Depression (mm): 0 mm
Stress Nuclear Isotope Dose: 32.6 mCi
TID: 1

## 2024-04-03 MED ORDER — TECHNETIUM TC 99M TETROFOSMIN IV KIT
10.3000 | PACK | Freq: Once | INTRAVENOUS | Status: AC | PRN
  Administered 2024-04-03: 10.3 via INTRAVENOUS

## 2024-04-03 MED ORDER — TECHNETIUM TC 99M TETROFOSMIN IV KIT
32.6000 | PACK | Freq: Once | INTRAVENOUS | Status: AC | PRN
  Administered 2024-04-03: 32.6 via INTRAVENOUS

## 2024-04-04 ENCOUNTER — Ambulatory Visit: Payer: Self-pay | Admitting: Cardiovascular Disease

## 2024-04-05 DIAGNOSIS — R55 Syncope and collapse: Secondary | ICD-10-CM | POA: Diagnosis not present

## 2024-04-05 DIAGNOSIS — R002 Palpitations: Secondary | ICD-10-CM | POA: Diagnosis not present

## 2024-04-18 ENCOUNTER — Ambulatory Visit: Attending: Cardiovascular Disease

## 2024-04-18 DIAGNOSIS — R55 Syncope and collapse: Secondary | ICD-10-CM

## 2024-04-18 DIAGNOSIS — E782 Mixed hyperlipidemia: Secondary | ICD-10-CM

## 2024-04-18 DIAGNOSIS — R002 Palpitations: Secondary | ICD-10-CM

## 2024-04-18 DIAGNOSIS — I4510 Unspecified right bundle-branch block: Secondary | ICD-10-CM

## 2024-04-22 DIAGNOSIS — R002 Palpitations: Secondary | ICD-10-CM

## 2024-04-22 DIAGNOSIS — R55 Syncope and collapse: Secondary | ICD-10-CM

## 2024-04-26 DIAGNOSIS — F4320 Adjustment disorder, unspecified: Secondary | ICD-10-CM | POA: Diagnosis not present

## 2024-04-26 DIAGNOSIS — E785 Hyperlipidemia, unspecified: Secondary | ICD-10-CM | POA: Diagnosis not present

## 2024-05-14 DIAGNOSIS — F4320 Adjustment disorder, unspecified: Secondary | ICD-10-CM | POA: Diagnosis not present

## 2024-05-27 DIAGNOSIS — E785 Hyperlipidemia, unspecified: Secondary | ICD-10-CM | POA: Diagnosis not present

## 2024-06-04 DIAGNOSIS — F4320 Adjustment disorder, unspecified: Secondary | ICD-10-CM | POA: Diagnosis not present

## 2024-06-06 DIAGNOSIS — H5203 Hypermetropia, bilateral: Secondary | ICD-10-CM | POA: Diagnosis not present

## 2024-06-06 DIAGNOSIS — H52203 Unspecified astigmatism, bilateral: Secondary | ICD-10-CM | POA: Diagnosis not present

## 2024-06-06 DIAGNOSIS — H2513 Age-related nuclear cataract, bilateral: Secondary | ICD-10-CM | POA: Diagnosis not present

## 2024-06-06 DIAGNOSIS — H18593 Other hereditary corneal dystrophies, bilateral: Secondary | ICD-10-CM | POA: Diagnosis not present

## 2024-06-06 DIAGNOSIS — H43813 Vitreous degeneration, bilateral: Secondary | ICD-10-CM | POA: Diagnosis not present

## 2024-06-25 DIAGNOSIS — F4320 Adjustment disorder, unspecified: Secondary | ICD-10-CM | POA: Diagnosis not present

## 2024-06-26 ENCOUNTER — Ambulatory Visit: Attending: Cardiovascular Disease | Admitting: Cardiovascular Disease

## 2024-06-26 ENCOUNTER — Encounter: Payer: Self-pay | Admitting: Cardiovascular Disease

## 2024-06-26 VITALS — BP 144/82 | HR 81 | Ht 67.0 in | Wt 163.0 lb

## 2024-06-26 DIAGNOSIS — E785 Hyperlipidemia, unspecified: Secondary | ICD-10-CM | POA: Diagnosis not present

## 2024-06-26 DIAGNOSIS — I2510 Atherosclerotic heart disease of native coronary artery without angina pectoris: Secondary | ICD-10-CM | POA: Diagnosis not present

## 2024-06-26 NOTE — Patient Instructions (Signed)

## 2024-06-26 NOTE — Progress Notes (Signed)
 Mary Walsh returns today for follow-up of her outpatient noninvasive tests done because of jaw pain and dizziness.  Her event monitor showed no arrhythmias.  Her Myoview  stress test performed 04/03/2024 was entirely normal.  She did have a coronary calcium  score performed 02/13/2023 that was 1.2.  She has had no recurrent jaw pain but has had 1 episode of dizziness which she attributes to anxiety.  Her lipid profile performed 02/15/2024 revealed an LDL 139.  She is statin intolerant and has seen Dr. Mona regarding this.  I will see her back in 1 year for follow-up.  Dorn DOROTHA Lesches, M.D., FACP, [facility], FAHA, [facility]  [address], [address] [place], [place]  [address]  [phone] 06/26/2024 10:37 AM

## 2024-06-29 DIAGNOSIS — Z0100 Encounter for examination of eyes and vision without abnormal findings: Secondary | ICD-10-CM | POA: Diagnosis not present

## 2024-07-09 DIAGNOSIS — F4320 Adjustment disorder, unspecified: Secondary | ICD-10-CM | POA: Diagnosis not present

## 2024-07-27 DIAGNOSIS — E785 Hyperlipidemia, unspecified: Secondary | ICD-10-CM | POA: Diagnosis not present

## 2024-07-30 DIAGNOSIS — F4320 Adjustment disorder, unspecified: Secondary | ICD-10-CM | POA: Diagnosis not present

## 2024-08-26 DIAGNOSIS — E785 Hyperlipidemia, unspecified: Secondary | ICD-10-CM | POA: Diagnosis not present

## 2024-08-27 DIAGNOSIS — F4320 Adjustment disorder, unspecified: Secondary | ICD-10-CM | POA: Diagnosis not present

## 2024-09-18 DIAGNOSIS — F4320 Adjustment disorder, unspecified: Secondary | ICD-10-CM | POA: Diagnosis not present

## 2024-09-28 ENCOUNTER — Other Ambulatory Visit: Payer: Self-pay | Admitting: Family Medicine

## 2024-09-28 DIAGNOSIS — Z1231 Encounter for screening mammogram for malignant neoplasm of breast: Secondary | ICD-10-CM

## 2024-10-02 ENCOUNTER — Inpatient Hospital Stay: Admission: RE | Admit: 2024-10-02 | Discharge: 2024-10-02 | Attending: Family Medicine | Admitting: Family Medicine

## 2024-10-02 DIAGNOSIS — Z1231 Encounter for screening mammogram for malignant neoplasm of breast: Secondary | ICD-10-CM
